# Patient Record
Sex: Female | Born: 1985 | Race: White | Hispanic: No | Marital: Single | Smoking: Current every day smoker
Health system: Southern US, Community
[De-identification: ages and names within clinical notes are randomized; demographics above are authoritative.]

## PROBLEM LIST (undated history)

## (undated) HISTORY — PX: TUBAL LIGATION: SHX77

## (undated) HISTORY — PX: CHOLECYSTECTOMY: SHX55

---

## 2013-05-18 ENCOUNTER — Encounter: Payer: Self-pay | Admitting: Maternal and Fetal Medicine

## 2013-10-07 ENCOUNTER — Observation Stay: Payer: Self-pay | Admitting: Obstetrics and Gynecology

## 2013-11-11 ENCOUNTER — Observation Stay: Payer: Self-pay | Admitting: Obstetrics and Gynecology

## 2013-11-11 LAB — URINALYSIS, COMPLETE
BILIRUBIN, UR: NEGATIVE
Bacteria: NONE SEEN
Blood: NEGATIVE
GLUCOSE, UR: NEGATIVE mg/dL (ref 0–75)
Ketone: NEGATIVE
LEUKOCYTE ESTERASE: NEGATIVE
NITRITE: NEGATIVE
PROTEIN: NEGATIVE
Ph: 6 (ref 4.5–8.0)
RBC,UR: 1 /HPF (ref 0–5)
SPECIFIC GRAVITY: 1.025 (ref 1.003–1.030)
Squamous Epithelial: 11
WBC UR: 5 /HPF (ref 0–5)

## 2013-11-23 ENCOUNTER — Observation Stay: Payer: Self-pay | Admitting: Obstetrics and Gynecology

## 2013-12-02 ENCOUNTER — Observation Stay: Payer: Self-pay | Admitting: Obstetrics and Gynecology

## 2013-12-05 ENCOUNTER — Inpatient Hospital Stay: Payer: Self-pay | Admitting: Obstetrics and Gynecology

## 2013-12-05 LAB — CBC WITH DIFFERENTIAL/PLATELET
BASOS ABS: 0.1 10*3/uL (ref 0.0–0.1)
BASOS PCT: 0.5 %
Eosinophil #: 0.1 10*3/uL (ref 0.0–0.7)
Eosinophil %: 0.4 %
HCT: 36.3 % (ref 35.0–47.0)
HGB: 11.7 g/dL — ABNORMAL LOW (ref 12.0–16.0)
LYMPHS ABS: 3.5 10*3/uL (ref 1.0–3.6)
Lymphocyte %: 18.3 %
MCH: 28.9 pg (ref 26.0–34.0)
MCHC: 32.1 g/dL (ref 32.0–36.0)
MCV: 90 fL (ref 80–100)
Monocyte #: 1.6 x10 3/mm — ABNORMAL HIGH (ref 0.2–0.9)
Monocyte %: 8.5 %
NEUTROS PCT: 72.3 %
Neutrophil #: 13.8 10*3/uL — ABNORMAL HIGH (ref 1.4–6.5)
PLATELETS: 263 10*3/uL (ref 150–440)
RBC: 4.04 10*6/uL (ref 3.80–5.20)
RDW: 14 % (ref 11.5–14.5)
WBC: 19.2 10*3/uL — AB (ref 3.6–11.0)

## 2013-12-05 LAB — GC/CHLAMYDIA PROBE AMP

## 2013-12-06 LAB — CBC WITH DIFFERENTIAL/PLATELET
Bands: 2 %
HCT: 35.5 % (ref 35.0–47.0)
HGB: 11.7 g/dL — ABNORMAL LOW (ref 12.0–16.0)
LYMPHS PCT: 23 %
MCH: 29.5 pg (ref 26.0–34.0)
MCHC: 33 g/dL (ref 32.0–36.0)
MCV: 90 fL (ref 80–100)
METAMYELOCYTE: 3 %
Monocytes: 2 %
Myelocyte: 1 %
Platelet: 262 10*3/uL (ref 150–440)
RBC: 3.97 10*6/uL (ref 3.80–5.20)
RDW: 14.1 % (ref 11.5–14.5)
Segmented Neutrophils: 66 %
Variant Lymphocyte - H1-Rlymph: 3 %
WBC: 14.1 10*3/uL — AB (ref 3.6–11.0)

## 2013-12-06 LAB — HEMATOCRIT: HCT: 35.1 % (ref 35.0–47.0)

## 2014-01-04 ENCOUNTER — Ambulatory Visit: Payer: Self-pay | Admitting: Obstetrics and Gynecology

## 2014-01-04 LAB — HEMOGLOBIN: HGB: 12.4 g/dL (ref 12.0–16.0)

## 2014-01-11 ENCOUNTER — Ambulatory Visit: Payer: Self-pay | Admitting: Obstetrics and Gynecology

## 2014-08-07 NOTE — Op Note (Signed)
PATIENT NAME:  Jillian Andersen, NEWGENT MR#:  [id number] DATE OF BIRTH:  [birth year]  DATE OF PROCEDURE:  01/11/2014  PREOPERATIVE DIAGNOSES:  1.  Multiparity, desiring permanent sterilization.  2.  Obesity.   POSTOPERATIVE DIAGNOSES: 1.  Multiparity, desiring permanent sterilization.  2.  Obesity.    OPERATION: Hysteroscopy with Essure tubal occlusion on right side only, failed occlusion of left side, and laparoscopic bilateral tubal ligation.   ANESTHESIA: General.   SURGEON: Hildred Laser, MD.   ESTIMATED BLOOD LOSS: Minimal.   OPERATIVE FLUIDS: 1200 mL.   URINE OUTPUT: 15 mL.   COMPLICATIONS: None.   FINDINGS: The uterus sounded to 8 cm.  On hysteroscopy, the endometrial lining was thinned, bilateral tubal ostia were identified.  The Essure coil was visualized from the right ostia after placement.  On laparoscopy, the uterus was noted to have several small anterior fibroids and another small fibroid near the fundus, but normal-appearing bilateral tubes and ovaries.   SPECIMENS: None.   TECHNIQUE: The patient was taken to the Operating Room where she was placed under general anesthesia without difficulty.  She was then prepped and draped in normal sterile fashion and placed, and dorsal lithotomy position using the candy cane stirrups.  A straight catheterization was performed.  Next, a sterile speculum was inserted into the vagina, and a single-tooth tenaculum was used to grasp the anterior lip of the cervix.  The cervix was noted to be appropriately dilated.  A 5 mm hysteroscope was introduced into the uterus under direct visualization.  The cavity was allowed to fill and the entire cavity was explored with the findings described above.  The Essure coil was introduced into the right tubal ostia and the device was deployed.  A total of 5 coils were noted to be protruding from the right fallopian tube ostia after deployment.  Attention was then turned to the left tubal ostia where Essure coil was  again attempted to be placed; however, the coil was noted to be bent at the tip and could not be used.  Because of this and the limited availability of other Essure devices, the decision was made to convert to laparoscopic bilateral tubal ligation (which the patient had also been previously consented for).  At this time, the speculum was removed.  The tenaculum was removed and excellent hemostasis was noted.  The patient was then re-prepped and re-draped for laparoscopic procedure and was placed in [place] stirrups. After this, the sterile speculum was then placed into the patient's vagina once again.  A Hulka clamp was placed for uterine manipulation. Attention was turned to the abdomen where a 1 cm infraumbilical skin incision was made.  Adjustment was made to the abdominal cavity and direct visualization with the insertion of a 12 mm trocar and sheath with the laparoscope introduced.  Once entrance into the abdominal cavity was confirmed, the abdomen was insufflated with appropriate volume and flow of CO2 gas.  Next, the Kleppinger device was inserted through the operative port of the laparoscope and the tube was grasped, elevated, and followed out to the fimbriated edge.  A 3 cm area was coagulated using the Kleppinger approximately 4 cm away from the tubal ostium.  This was then cut with the laparoscopic scissors.  Attention was then turned to the right fallopian tube where a similar procedure was carried out in the same fashion.  All areas were noted to be hemostatic.  All instruments were removed from the patient's abdomen and the abdomen was allowed to be  desufflated.  The trocar was removed from the patient's abdomen. The umbilical incision was then closed with 0 Vicryl at the level of the fascia in a figure-of-eight fashion. The skin was then closed using 4-0 Vicryl in a subcuticular fashion.  The incision was injected with 10 mL of 0.5% Sensorcaine.  The incision was then covered with Dermabond.  The Hulka  clamp was removed from the cervix.  The patient tolerated the procedure well.  Sponge, lap, and needle counts were correct x 2.  She will follow up postoperatively for follow-up care in approximately 2 weeks.      ____________________________ Jacques EarthlyAnika S. Valentino Saxonherry, MD asc:DT D: 01/11/2014 11:32:33 ET T: 01/11/2014 12:18:41 ET JOB#: [id number]  cc: Jacques EarthlyAnika S. Valentino Saxonherry, MD, <Dictator> Fabian NovemberANIKA S Rilda Bulls MD ELECTRONICALLY SIGNED 01/18/2014 7:53

## 2014-08-24 NOTE — H&P (Signed)
L&D Evaluation:  History:  HPI Jillian Andersen is a 29 y.o. [id number] Caucasian female at 40.0 weeks by LMP 02/13/14, EDD 12/05/13 who presented with c/o contractions, worsening since 5 a.m.  Contractions q 5-10 min apart. Denies LOF, decreased FM, vaginal bleeding.  Patient received PNC at [facility].   Presents with contractions   Patient's Medical History H/o postpartum depression, trichomoniasis this pregnancy (treated), obesity, h/o gallbladder cancer?   Patient's Surgical History Colecystectomy   Medications Pre Natal Vitamins   Allergies NKDA   Social History h/o tobacco abuse, h/o alchohol abuse (last use 2014), cannabinoid + UDS 09/26/13   Family History Diabetes   ROS:  General normal   HEENT normal   CNS normal   GI normal   GU contractions   Resp normal   CV normal   Renal normal   MS normal   Exam:  Vital Signs stable   Urine Protein negative dipstick   General no apparent distress, is s/p epidural   Mental Status clear   Chest clear   Heart normal sinus rhythm, no murmur/gallop/rubs   Abdomen gravid, non-tender   Estimated Fetal Weight Average for gestational age   Fetal Position cephalic   Back no CVAT   Edema no edema   Pelvic no external lesions, 6-7/100/c/-2   Mebranes Intact   FHT normal rate with no decels   Fetal Heart Rate 135   Ucx regular   Ucx Frequency 2 min   Length of each Contraction 30 seconds   Ucx Pain Scale 1   Skin dry, no lesions, no rashes   Lymph no lymphadenopathy   Other A+/-/ND/NR/RNI/VI/GC-/Cl-/GBS-.   Hgb/Hct (12/05/13): 19.2>11.7/36.3<263   Impression:  Impression active labor   Plan:  Plan monitor contractions and for cervical change   Comments Admitted to Labor and Delivery NPO Is s/p epidural placement.  Pitocin for contractions (spaced out after epidural).  Currently at 9 mIU. Will AROM. Moderate Leukocytosis - no s/s of chorioamnionitis. Will continue to monitor.   Electronic  Signatures: Fabian Novemberherry, Kanitra Purifoy S (MD)  (Signed 22-Aug-15 12:21)  Authored: L&D Evaluation   Last Updated: 22-Aug-15 12:21 by Fabian Novemberherry, Renardo Cheatum S (MD)

## 2014-10-30 ENCOUNTER — Encounter: Payer: Self-pay | Admitting: Emergency Medicine

## 2014-10-30 ENCOUNTER — Emergency Department: Payer: Self-pay

## 2014-10-30 ENCOUNTER — Emergency Department
Admission: EM | Admit: 2014-10-30 | Discharge: 2014-10-30 | Disposition: A | Discharge status: Home / Self Care | Payer: Self-pay | Attending: Emergency Medicine | Admitting: Emergency Medicine

## 2014-10-30 DIAGNOSIS — G43909 Migraine, unspecified, not intractable, without status migrainosus: Secondary | ICD-10-CM | POA: Insufficient documentation

## 2014-10-30 DIAGNOSIS — Z720 Tobacco use: Secondary | ICD-10-CM | POA: Insufficient documentation

## 2014-10-30 LAB — CBC WITH DIFFERENTIAL/PLATELET
BASOS PCT: 1 %
Basophils Absolute: 0.1 10*3/uL (ref 0–0.1)
Eosinophils Absolute: 0.1 10*3/uL (ref 0–0.7)
Eosinophils Relative: 1 %
HCT: 40.9 % (ref 35.0–47.0)
Hemoglobin: 13.7 g/dL (ref 12.0–16.0)
LYMPHS PCT: 38 %
Lymphs Abs: 3.6 10*3/uL (ref 1.0–3.6)
MCH: 29.4 pg (ref 26.0–34.0)
MCHC: 33.6 g/dL (ref 32.0–36.0)
MCV: 87.5 fL (ref 80.0–100.0)
MONOS PCT: 8 %
Monocytes Absolute: 0.8 10*3/uL (ref 0.2–0.9)
NEUTROS ABS: 4.8 10*3/uL (ref 1.4–6.5)
NEUTROS PCT: 52 %
Platelets: 246 10*3/uL (ref 150–440)
RBC: 4.67 MIL/uL (ref 3.80–5.20)
RDW: 13.2 % (ref 11.5–14.5)
WBC: 9.3 10*3/uL (ref 3.6–11.0)

## 2014-10-30 LAB — COMPREHENSIVE METABOLIC PANEL
ALT: 16 U/L (ref 14–54)
AST: 15 U/L (ref 15–41)
Albumin: 4 g/dL (ref 3.5–5.0)
Alkaline Phosphatase: 73 U/L (ref 38–126)
Anion gap: 8 (ref 5–15)
BUN: 14 mg/dL (ref 6–20)
CALCIUM: 9.4 mg/dL (ref 8.9–10.3)
CO2: 26 mmol/L (ref 22–32)
Chloride: 106 mmol/L (ref 101–111)
Creatinine, Ser: 0.79 mg/dL (ref 0.44–1.00)
GFR calc Af Amer: >60 mL/min (ref >60)
GLUCOSE: 91 mg/dL (ref 65–99)
Potassium: 3.6 mmol/L (ref 3.5–5.1)
SODIUM: 140 mmol/L (ref 135–145)
Total Bilirubin: 0.5 mg/dL (ref 0.3–1.2)
Total Protein: 7.3 g/dL (ref 6.5–8.1)

## 2014-10-30 MED ORDER — METOCLOPRAMIDE HCL 5 MG/ML IJ SOLN
10.0000 mg | Freq: Once | INTRAMUSCULAR | Status: AC
  Administered 2014-10-30: 10 mg via INTRAVENOUS
  Filled 2014-10-30: qty 2

## 2014-10-30 MED ORDER — DIPHENHYDRAMINE HCL 50 MG/ML IJ SOLN
12.5000 mg | Freq: Once | INTRAMUSCULAR | Status: AC
  Administered 2014-10-30: 12.5 mg via INTRAVENOUS
  Filled 2014-10-30: qty 1

## 2014-10-30 MED ORDER — SODIUM CHLORIDE 0.9 % IV BOLUS (SEPSIS)
1000.0000 mL | Freq: Once | INTRAVENOUS | Status: AC
  Administered 2014-10-30: 1000 mL via INTRAVENOUS

## 2014-10-30 MED ORDER — KETOROLAC TROMETHAMINE 30 MG/ML IJ SOLN
30.0000 mg | Freq: Once | INTRAMUSCULAR | Status: AC
  Administered 2014-10-30: 30 mg via INTRAVENOUS
  Filled 2014-10-30: qty 1

## 2014-10-30 MED ORDER — KETOROLAC TROMETHAMINE 10 MG PO TABS: 10.0000 mg | ORAL_TABLET | Freq: Four times a day (QID) | ORAL | Status: DC | PRN

## 2014-10-30 NOTE — Discharge Instructions (Signed)

## 2014-10-30 NOTE — ED Notes (Signed)
Case reviewed with PA, pt to flex.

## 2014-10-30 NOTE — ED Notes (Signed)
Denies history of Migraines

## 2014-10-30 NOTE — ED Notes (Signed)
Patient reports headache to mainly left side of head that has been going on for 3-4 days, taking over the counter meds without relief. Patient reports sensitivity to light and sound.

## 2014-10-30 NOTE — ED Provider Notes (Signed)
[facility] Emergency Department Provider Note ____________________________________________  Time seen: Approximately 7:02 PM  I have reviewed the triage vital signs and the nursing notes.   HISTORY  Chief Complaint Headache   HPI Jillian Andersen is a 29 y.o. female who presents to the emergency department for a headache. Headache is on the left side of her face and behind that left eye. She has never had a headache like this before. She states the headache started 3-4 days ago while at rest. She has taken ibuprofen twice since the onset without any relief. She states that she has occasional nausea. She states that light makes the headache much worse. Change of position and found does not affect the level of pain.   History reviewed. No pertinent past medical history.  There are no active problems to display for this patient.   History reviewed. No pertinent past surgical history.  Current Outpatient Rx  Name  Route  Sig  Dispense  Refill  . ketorolac (TORADOL) 10 MG tablet   Oral   Take 1 tablet (10 mg total) by mouth every 6 (six) hours as needed.   20 tablet   0     Allergies Review of patient's allergies indicates no known allergies.  History reviewed. No pertinent family history.  Social History History  Substance Use Topics  . Smoking status: Current Every Day Smoker  . Smokeless tobacco: Not on file  . Alcohol Use: No    Review of Systems Constitutional: No fever/chills Eyes: No visual changes. ENT: No sore throat. Cardiovascular: Denies chest pain. Respiratory: Denies shortness of breath. Gastrointestinal: No abdominal pain.  No nausea, no vomiting.  No diarrhea.  No constipation. Genitourinary: Negative for dysuria. Musculoskeletal: Negative for back pain. Skin: Negative for rash. Neurological: Positive for headaches, negative for focal weakness or numbness. Psychiatric:Normal mood and affect  10-point ROS otherwise  negative.  ____________________________________________   PHYSICAL EXAM:  VITAL SIGNS: ED Triage Vitals  Enc Vitals Group     BP 10/30/14 1540 133/115 mmHg     Pulse Rate 10/30/14 1540 93     Resp 10/30/14 1540 20     Temp 10/30/14 1540 98.1 F (36.7 C)     Temp Source 10/30/14 1540 Oral     SpO2 10/30/14 1540 96 %     Weight 10/30/14 1540 226 lb (102.513 kg)     Height 10/30/14 1540  (1.702 m)     Head Cir --      Peak Flow --      Pain Score 10/30/14 1541 8     Pain Loc --      Pain Edu? --      Excl. in GC? --     Constitutional: Alert and oriented. Well appearing and in no acute distress. Eyes: Conjunctivae are normal. PERRL. EOMI. Head: Atraumatic. Nose: No congestion/rhinnorhea. Mouth/Throat: Mucous membranes are moist.  Oropharynx non-erythematous. Neck: No stridor.   Cardiovascular: Normal rate, regular rhythm. Grossly normal heart sounds.  Good peripheral circulation. Respiratory: Normal respiratory effort.  No retractions. Lungs CTAB. Gastrointestinal: Soft and nontender. No distention. No abdominal bruits. No CVA tenderness. Musculoskeletal: No lower extremity tenderness nor edema.  No joint effusions. Neurologic:  Normal speech and language. No gross focal neurologic deficits are appreciated. No gait instability. Negative Romberg, negative finger to nose test Skin:  Skin is warm, dry and intact. No rash noted. Psychiatric: Mood and affect are normal. Speech and behavior are normal.  ____________________________________________  LABS (all labs ordered are listed, but only abnormal results are displayed)  Labs Reviewed  CBC WITH DIFFERENTIAL/PLATELET  COMPREHENSIVE METABOLIC PANEL   ____________________________________________  EKG   ____________________________________________  RADIOLOGY  CT head negative for acute pathology. ____________________________________________   PROCEDURES  Procedure(s) performed: None  Critical Care  performed: No  ____________________________________________   INITIAL IMPRESSION / ASSESSMENT AND PLAN / ED COURSE  Pertinent labs & imaging results that were available during my care of the patient were reviewed by me and considered in my medical decision making (see chart for details).  Patient received Toradol, Reglan, and Benadryl with complete relief of headache. She will be discharged home with a prescription for Toradol if the headache returns she is to take that as prescribed. She is to follow-up with her primary care provider or neurology for recurring headaches. She was advised to return to the emergency department for symptoms that change or worsen if she is unable schedule an appointment. ____________________________________________   FINAL CLINICAL IMPRESSION(S) / ED DIAGNOSES  Final diagnoses:  Migraine without status migrainosus, not intractable, unspecified migraine type      Chinita PesterCari B Rola Lennon, FNP 10/30/14 2346  Loleta Roseory Forbach, MD 10/31/14 0000

## 2015-04-10 ENCOUNTER — Encounter: Payer: Self-pay | Admitting: *Deleted

## 2015-04-10 ENCOUNTER — Emergency Department
Admission: EM | Admit: 2015-04-10 | Discharge: 2015-04-10 | Disposition: A | Discharge status: Home / Self Care | Payer: Self-pay | Attending: Emergency Medicine | Admitting: Emergency Medicine

## 2015-04-10 ENCOUNTER — Emergency Department: Payer: Self-pay

## 2015-04-10 DIAGNOSIS — J029 Acute pharyngitis, unspecified: Secondary | ICD-10-CM | POA: Insufficient documentation

## 2015-04-10 DIAGNOSIS — R61 Generalized hyperhidrosis: Secondary | ICD-10-CM | POA: Insufficient documentation

## 2015-04-10 DIAGNOSIS — F17200 Nicotine dependence, unspecified, uncomplicated: Secondary | ICD-10-CM | POA: Insufficient documentation

## 2015-04-10 DIAGNOSIS — F419 Anxiety disorder, unspecified: Secondary | ICD-10-CM | POA: Insufficient documentation

## 2015-04-10 LAB — CBC
HCT: 41.5 % (ref 35.0–47.0)
Hemoglobin: 13.8 g/dL (ref 12.0–16.0)
MCH: 28.8 pg (ref 26.0–34.0)
MCHC: 33.2 g/dL (ref 32.0–36.0)
MCV: 86.7 fL (ref 80.0–100.0)
Platelets: 226 10*3/uL (ref 150–440)
RBC: 4.79 MIL/uL (ref 3.80–5.20)
RDW: 13.2 % (ref 11.5–14.5)
WBC: 16 10*3/uL — ABNORMAL HIGH (ref 3.6–11.0)

## 2015-04-10 LAB — COMPREHENSIVE METABOLIC PANEL
ALK PHOS: 98 U/L (ref 38–126)
ALT: 95 U/L — ABNORMAL HIGH (ref 14–54)
ANION GAP: 8 (ref 5–15)
AST: 64 U/L — AB (ref 15–41)
Albumin: 4.3 g/dL (ref 3.5–5.0)
BILIRUBIN TOTAL: 1.5 mg/dL — AB (ref 0.3–1.2)
BUN: 16 mg/dL (ref 6–20)
CO2: 26 mmol/L (ref 22–32)
Calcium: 9.3 mg/dL (ref 8.9–10.3)
Chloride: 103 mmol/L (ref 101–111)
Creatinine, Ser: 0.74 mg/dL (ref 0.44–1.00)
GFR calc Af Amer: >60 mL/min (ref >60)
GFR calc non Af Amer: >60 mL/min (ref >60)
GLUCOSE: 94 mg/dL (ref 65–99)
Potassium: 3.7 mmol/L (ref 3.5–5.1)
SODIUM: 137 mmol/L (ref 135–145)
Total Protein: 8 g/dL (ref 6.5–8.1)

## 2015-04-10 LAB — TROPONIN I: Troponin I: <0.03 ng/mL (ref <0.031)

## 2015-04-10 LAB — POCT RAPID STREP A: Streptococcus, Group A Screen (Direct): NEGATIVE

## 2015-04-10 MED ORDER — PENICILLIN V POTASSIUM 250 MG PO TABS
250.0000 mg | ORAL_TABLET | Freq: Four times a day (QID) | ORAL | Status: DC
Start: 2015-04-10 — End: 2024-02-11

## 2015-04-10 MED ORDER — EPINEPHRINE HCL 1 MG/ML IJ SOLN
INTRAMUSCULAR | Status: AC
  Administered 2015-04-10: 0.3 mg
  Filled 2015-04-10: qty 1

## 2015-04-10 MED ORDER — SODIUM CHLORIDE 0.9 % IV SOLN
1000.0000 mL | Freq: Once | INTRAVENOUS | Status: AC
  Administered 2015-04-10: 1000 mL via INTRAVENOUS

## 2015-04-10 MED ORDER — DEXAMETHASONE SODIUM PHOSPHATE 10 MG/ML IJ SOLN
10.0000 mg | Freq: Once | INTRAMUSCULAR | Status: AC
  Administered 2015-04-10: 10 mg via INTRAVENOUS
  Filled 2015-04-10: qty 1

## 2015-04-10 MED ORDER — EPINEPHRINE 0.3 MG/0.3ML IJ SOAJ: 0.3000 mg | Freq: Once | INTRAMUSCULAR | Status: AC

## 2015-04-10 NOTE — Discharge Instructions (Signed)

## 2015-04-10 NOTE — ED Provider Notes (Signed)
[facility] Emergency Department Provider Note  ____________________________________________  Time seen: On arrival  I have reviewed the triage vital signs and the nursing notes.   HISTORY  Chief Complaint Shortness of Breath and Sore Throat    HPI Jillian Andersen is a 29 y.o. female who presents with complaints of syncope and difficulty swallowing. Apparently patient was taking care of the kids and became lightheaded and syncopized. Afterwards she complained of sore throat and a sensation of swelling in her throat. She denies rash or itching. No nausea or vomiting. No fevers or chills. No sick contacts, no shortness of breath at this time     History reviewed. No pertinent past medical history.  There are no active problems to display for this patient.   History reviewed. No pertinent past surgical history.  Current Outpatient Rx  Name  Route  Sig  Dispense  Refill  . ketorolac (TORADOL) 10 MG tablet   Oral   Take 1 tablet (10 mg total) by mouth Andersen 6 (six) hours as needed.   20 tablet   0     Allergies Review of patient's allergies indicates no known allergies.  History reviewed. No pertinent family history.  Social History Social History  Substance Use Topics  . Smoking status: Current Andersen Day Smoker  . Smokeless tobacco: None  . Alcohol Use: No    Review of Systems  Constitutional: Negative for fever. Eyes: Negative for visual changes. ENT: As above Cardiovascular: Negative for chest pain. Respiratory: As above Gastrointestinal: Negative for abdominal pain, vomiting and diarrhea. Genitourinary: Negative for dysuria. Musculoskeletal: Negative for back pain. Skin: Negative for rash. Neurological: Negative for headaches or focal weakness Psychiatric: Positive for anxiety    ____________________________________________   PHYSICAL EXAM:  VITAL SIGNS: ED Triage Vitals  Enc Vitals Group     BP --      Pulse Rate  04/10/15 1227 81     Resp 04/10/15 1227 20     Temp 04/10/15 1227 98.3 F (36.8 C)     Temp Source 04/10/15 1227 Oral     SpO2 04/10/15 1227 98 %     Weight 04/10/15 1227 230 lb (104.327 kg)     Height 04/10/15 1227 5\' 3"  (1.6 m)     Head Cir --      Peak Flow --      Pain Score 04/10/15 1230 7     Pain Loc --      Pain Edu? --      Excl. in GC? --      Constitutional: Alert and oriented but anxious, diaphoretic Eyes: Conjunctivae are normal.  ENT   Head: Normocephalic and atraumatic.   Mouth/Throat: Mucous membranes are moist. Enlarged tonsils bilaterally, positive erythema, no exudate noted, uvula is normal Cardiovascular: Normal rate, regular rhythm. Normal and symmetric distal pulses are present in all extremities. No murmurs, rubs, or gallops. Respiratory: Normal respiratory effort without tachypnea nor retractions. Breath sounds are clear and equal bilaterally. No stridor Gastrointestinal: Soft and non-tender in all quadrants. No distention. There is no CVA tenderness. Genitourinary: deferred Musculoskeletal: Nontender with normal range of motion in all extremities. No lower extremity tenderness nor edema. Neurologic:  Normal speech and language. No gross focal neurologic deficits are appreciated. Skin:  Skin is warm, diaphoretic and intact. No rash noted. Psychiatric: Mood and affect are normal. Patient exhibits appropriate insight and judgment.  ____________________________________________    LABS (pertinent positives/negatives)  Labs Reviewed  CBC  COMPREHENSIVE METABOLIC  PANEL  TROPONIN I    ____________________________________________   EKG   ____________________________________________    RADIOLOGY I have personally reviewed any xrays that were ordered on this patient: Soft tissue neck is normal  ____________________________________________   PROCEDURES  Procedure(s) performed: none  Critical Care performed:  none  ____________________________________________   INITIAL IMPRESSION / ASSESSMENT AND PLAN / ED COURSE  Pertinent labs & imaging results that were available during my care of the patient were reviewed by me and considered in my medical decision making (see chart for details).  Given syncope, diaphoresis and sensation of throat swelling strong concern for anaphylaxis, IM epi 0.3 mg 1 1000 ordered Decadron IV given. Normal saline ordered.  Upon further questioning patient admits to having sore throat this morning which has worsened throughout the day which lessens the likelihood of anaphylaxis.  ----------------------------------------- 2:04 PM on 04/10/2015 -----------------------------------------  Patient is feeling improved. Elevated white blood cell count and history of present illness of worsening sore throat since this a.m. makes me feel more comfortable that this is pharyngitis as opposed to anaphylaxis. Unclear cause of syncope but no chest pain shortness of breath and labwork and EKG unremarkable. She has received Decadron and I will discharge her with antibiotics. Return precautions discussed. All vitals normal discharge  ____________________________________________   FINAL CLINICAL IMPRESSION(S) / ED DIAGNOSES  Final diagnoses:  Pharyngitis     Jillian Every, MD 04/10/15 1406

## 2015-04-10 NOTE — ED Notes (Signed)
Pt had syncopal episode at her familys house and now states she feels difficulty swallowing, arrives with swollen tonsils and throats, pt on RA, MD at bedside, pt able to speak in full sentances, tearful

## 2015-04-13 LAB — CULTURE, GROUP A STREP (THRC)

## 2016-01-14 ENCOUNTER — Emergency Department
Admission: EM | Admit: 2016-01-14 | Discharge: 2016-01-14 | Disposition: A | Discharge status: Home / Self Care | Payer: Self-pay | Attending: Student | Admitting: Student

## 2016-01-14 ENCOUNTER — Encounter: Payer: Self-pay | Admitting: Emergency Medicine

## 2016-01-14 DIAGNOSIS — J020 Streptococcal pharyngitis: Secondary | ICD-10-CM | POA: Insufficient documentation

## 2016-01-14 DIAGNOSIS — R197 Diarrhea, unspecified: Secondary | ICD-10-CM | POA: Insufficient documentation

## 2016-01-14 DIAGNOSIS — Z79899 Other long term (current) drug therapy: Secondary | ICD-10-CM | POA: Insufficient documentation

## 2016-01-14 DIAGNOSIS — F17210 Nicotine dependence, cigarettes, uncomplicated: Secondary | ICD-10-CM | POA: Insufficient documentation

## 2016-01-14 DIAGNOSIS — R252 Cramp and spasm: Secondary | ICD-10-CM | POA: Insufficient documentation

## 2016-01-14 LAB — CBC WITH DIFFERENTIAL/PLATELET
Basophils Absolute: 0.1 10*3/uL (ref 0–0.1)
Basophils Relative: 1 %
EOS PCT: 0 %
Eosinophils Absolute: 0 10*3/uL (ref 0–0.7)
HEMATOCRIT: 41.4 % (ref 35.0–47.0)
Hemoglobin: 14.3 g/dL (ref 12.0–16.0)
LYMPHS ABS: 1.8 10*3/uL (ref 1.0–3.6)
LYMPHS PCT: 9 %
MCH: 30.3 pg (ref 26.0–34.0)
MCHC: 34.5 g/dL (ref 32.0–36.0)
MCV: 87.8 fL (ref 80.0–100.0)
MONO ABS: 1.2 10*3/uL — AB (ref 0.2–0.9)
Monocytes Relative: 6 %
Neutro Abs: 16.6 10*3/uL — ABNORMAL HIGH (ref 1.4–6.5)
Neutrophils Relative %: 84 %
PLATELETS: 244 10*3/uL (ref 150–440)
RBC: 4.72 MIL/uL (ref 3.80–5.20)
RDW: 13.7 % (ref 11.5–14.5)
WBC: 19.7 10*3/uL — ABNORMAL HIGH (ref 3.6–11.0)

## 2016-01-14 LAB — COMPREHENSIVE METABOLIC PANEL
ALT: 19 U/L (ref 14–54)
AST: 21 U/L (ref 15–41)
Albumin: 4.1 g/dL (ref 3.5–5.0)
Alkaline Phosphatase: 79 U/L (ref 38–126)
Anion gap: 9 (ref 5–15)
BILIRUBIN TOTAL: 1.3 mg/dL — AB (ref 0.3–1.2)
BUN: 16 mg/dL (ref 6–20)
CALCIUM: 8.9 mg/dL (ref 8.9–10.3)
CHLORIDE: 100 mmol/L — AB (ref 101–111)
CO2: 27 mmol/L (ref 22–32)
CREATININE: 1.23 mg/dL — AB (ref 0.44–1.00)
GFR, EST NON AFRICAN AMERICAN: 57 mL/min — AB (ref >60)
Glucose, Bld: 104 mg/dL — ABNORMAL HIGH (ref 65–99)
Potassium: 3.5 mmol/L (ref 3.5–5.1)
Sodium: 136 mmol/L (ref 135–145)
TOTAL PROTEIN: 7.9 g/dL (ref 6.5–8.1)

## 2016-01-14 LAB — POCT PREGNANCY, URINE: Preg Test, Ur: NEGATIVE

## 2016-01-14 LAB — POCT RAPID STREP A: STREPTOCOCCUS, GROUP A SCREEN (DIRECT): POSITIVE — AB

## 2016-01-14 LAB — INFLUENZA PANEL BY PCR (TYPE A & B)
H1N1 flu by pcr: NOT DETECTED
INFLAPCR: NEGATIVE
Influenza B By PCR: NEGATIVE

## 2016-01-14 LAB — MAGNESIUM: MAGNESIUM: 1.5 mg/dL — AB (ref 1.7–2.4)

## 2016-01-14 MED ORDER — MAGNESIUM SULFATE 2 GM/50ML IV SOLN
2.0000 g | Freq: Once | INTRAVENOUS | Status: AC
  Administered 2016-01-14: 2 g via INTRAVENOUS
  Filled 2016-01-14: qty 50

## 2016-01-14 MED ORDER — ACETAMINOPHEN 500 MG PO TABS
1000.0000 mg | ORAL_TABLET | Freq: Once | ORAL | Status: AC
  Administered 2016-01-14: 1000 mg via ORAL
  Filled 2016-01-14: qty 2

## 2016-01-14 MED ORDER — AMOXICILLIN 500 MG PO TABS: 500.0000 mg | ORAL_TABLET | Freq: Two times a day (BID) | ORAL | 0 refills | Status: AC

## 2016-01-14 MED ORDER — SODIUM CHLORIDE 0.9 % IV BOLUS (SEPSIS)
1000.0000 mL | Freq: Once | INTRAVENOUS | Status: AC
  Administered 2016-01-14: 1000 mL via INTRAVENOUS

## 2016-01-14 MED ORDER — KETOROLAC TROMETHAMINE 30 MG/ML IJ SOLN
15.0000 mg | Freq: Once | INTRAMUSCULAR | Status: AC
  Administered 2016-01-14: 15 mg via INTRAVENOUS
  Filled 2016-01-14: qty 1

## 2016-01-14 MED ORDER — AMOXICILLIN 500 MG PO CAPS
500.0000 mg | ORAL_CAPSULE | Freq: Once | ORAL | Status: AC
  Administered 2016-01-14: 500 mg via ORAL
  Filled 2016-01-14: qty 1

## 2016-01-14 NOTE — ED Notes (Signed)
Dr. Inocencio HomesGayle informed patients rapid strept test was positive

## 2016-01-14 NOTE — ED Provider Notes (Signed)
[facility] Emergency Department Provider Note   ____________________________________________   First MD Initiated Contact with Patient 01/14/16 1054     (approximate)  I have reviewed the triage vital signs and the nursing notes.   HISTORY  Chief Complaint Anxiety; Fever; and Sore Throat    HPI Jillian Andersen is a 30 y.o. female with no chronic medical problems who presents for evaluation of sore throat as well as hand cramping since last night, myalgias, fevers, gradual onset, constant, severe, no modifying factors she is also had nonbloody diarrhea. No abdominal pain, no nausea or vomiting. She has had runny nose and cough. No chest pain or difficulty breathing.   History reviewed. No pertinent past medical history.  There are no active problems to display for this patient.   Past Surgical History:  Procedure Laterality Date  . CHOLECYSTECTOMY      Prior to Admission medications   Medication Sig Start Date End Date Taking? Authorizing Provider  ketorolac (TORADOL) 10 MG tablet Take 1 tablet (10 mg total) by mouth every 6 (six) hours as needed. 10/30/14   Chinita Pesterari B Triplett, FNP  penicillin v potassium (VEETID) 250 MG tablet Take 1 tablet (250 mg total) by mouth 4 (four) times daily. 04/10/15   Jene Everyobert Kinner, MD    Allergies Review of patient's allergies indicates no known allergies.  History reviewed. No pertinent family history.  Social History Social History  Substance Use Topics  . Smoking status: Current Every Day Smoker    Packs/day: 0.50    Types: Cigarettes  . Smokeless tobacco: Never Used  . Alcohol use No    Review of Systems Constitutional: + fever/chills Eyes: No visual changes. ENT: + sore throat. Cardiovascular: Denies chest pain. Respiratory: Denies shortness of breath. Gastrointestinal: No abdominal pain.  No nausea, no vomiting.  + diarrhea.  No constipation. Genitourinary: Negative for dysuria. Musculoskeletal:  Negative for back pain. Skin: Negative for rash. Neurological: Negative for headaches, focal weakness or numbness.  10-point ROS otherwise negative.  ____________________________________________   PHYSICAL EXAM:  Vitals:   01/14/16 1045 01/14/16 1057 01/14/16 1248  BP:  (!) 102/52 (!) 122/59  Pulse: (!) 113 98 90  Resp:  20 15  Temp: (!) 101.3 F (38.5 C)  98.8 F (37.1 C)  TempSrc: Oral  Oral  SpO2: 100% 100% 98%  Weight: 200 lb (90.7 kg)    Height: 5\' 7"  (1.702 m)      VITAL SIGNS: ED Triage Vitals  Enc Vitals Group     BP 01/14/16 1057 (!) 102/52     Pulse Rate 01/14/16 1045 (!) 113     Resp 01/14/16 1057 20     Temp 01/14/16 1045 (!) 101.3 F (38.5 C)     Temp Source 01/14/16 1045 Oral     SpO2 01/14/16 1045 100 %     Weight 01/14/16 1045 200 lb (90.7 kg)     Height 01/14/16 1045 5\' 7"  (1.702 m)     Head Circumference --      Peak Flow --      Pain Score 01/14/16 1045 8     Pain Loc --      Pain Edu? --      Excl. in GC? --     Constitutional: Alert and oriented. Nontoxic-appearing and in no acute distress. Eyes: Conjunctivae are normal. PERRL. EOMI. Head: Atraumatic. Nose: No congestion/rhinnorhea. Mouth/Throat: Mucous membranes are moist.  Oropharynx is erythematous without exudate, the tonsils are enlarged bilaterally,  symmetrically. There is no uvular deviation. No hoarse voice, handling secretions. Neck: No stridor.  Supple without meningismus. Cardiovascular: Normal rate, regular rhythm. Grossly normal heart sounds.  Good peripheral circulation. Respiratory: Normal respiratory effort.  No retractions. Lungs CTAB. Gastrointestinal: Soft and nontender. No distention. No CVA tenderness. Genitourinary: deferred Musculoskeletal: No lower extremity tenderness nor edema.  No joint effusions. Neurologic:  Normal speech and language. No gross focal neurologic deficits are appreciated. No gait instability. Skin:  Skin is warm, dry and intact. No rash  noted. Psychiatric: Mood and affect are normal. Speech and behavior are normal.  ____________________________________________   LABS (all labs ordered are listed, but only abnormal results are displayed)  Labs Reviewed  CBC WITH DIFFERENTIAL/PLATELET - Abnormal; Notable for the following:       Result Value   WBC 19.7 (*)    Neutro Abs 16.6 (*)    Monocytes Absolute 1.2 (*)    All other components within normal limits  COMPREHENSIVE METABOLIC PANEL - Abnormal; Notable for the following:    Chloride 100 (*)    Glucose, Bld 104 (*)    Creatinine, Ser 1.23 (*)    Total Bilirubin 1.3 (*)    GFR calc non Af Amer 57 (*)    All other components within normal limits  MAGNESIUM - Abnormal; Notable for the following:    Magnesium 1.5 (*)    All other components within normal limits  POCT RAPID STREP A - Abnormal; Notable for the following:    Streptococcus, Group A Screen (Direct) POSITIVE (*)    All other components within normal limits  INFLUENZA PANEL BY PCR (TYPE A & B, H1N1)  POC URINE PREG, ED  POCT PREGNANCY, URINE   ____________________________________________  EKG  none ____________________________________________  RADIOLOGY  none ____________________________________________   PROCEDURES  Procedure(s) performed: None  Procedures  Critical Care performed: No  ____________________________________________   INITIAL IMPRESSION / ASSESSMENT AND PLAN / ED COURSE  Pertinent labs & imaging results that were available during my care of the patient were reviewed by me and considered in my medical decision making (see chart for details).  Jillian Andersen is a 30 y.o. female with no chronic medical problems who presents for evaluation of sore throat as well as hand cramping since last night. On exam, she is nontoxic appearing and in no acute distress. She is febrile and was mildly tachycardic on arrival however that tachycardia resolved at the time of my assessment. She  does have erythema of the oropharynx without evidence of peritonsillar abscess, we'll obtain screening labs, including magnesium given her complaints of hand cramping. We'll swab for strep, treat her symptomatically with antipyretics and IV fluids. Reassess for disposition. Will swab for influenza.  ----------------------------------------- 1:47 PM on 01/14/2016 ----------------------------------------- Patient reports she feels much better at this time. Her vital signs have normalized. White blood cell count was elevated at 19.7, strep test is positive which I suspect is the most likely cause of her constellation of symptoms. She received amoxicillin and will be discharged with the same. Influenza negative. CMP with mild creatinine elevation at 1.23,  she received IV fluids. Her magnesium was slightly low at 1.5 which could have caused her to have hand cramping, she received 2 g of magnesium IV. Pregnancy test is negative. We discussed return precautions, need for close PCP follow-up and she is comfortable with the discharge plan. DC home.   Clinical Course     ____________________________________________   FINAL CLINICAL IMPRESSION(S) / ED DIAGNOSES  Final diagnoses:  Strep throat  Hypomagnesemia  Cramping of hands      NEW MEDICATIONS STARTED DURING THIS VISIT:  New Prescriptions   No medications on file     Note:  This document was prepared using Dragon voice recognition software and may include unintentional dictation errors.    Gayla Doss, MD 01/14/16 [phone]

## 2016-01-14 NOTE — ED Triage Notes (Signed)
Pt presents to ED via POV by herself. Pt states since last night she has been having a sore throat and muscle spasms in her hands.  Pt is anxious and panicking in triage.  Pt unable to tolerate BP cuff as she said her muscles in her arm and hand were spasms, pt screaming in pain.

## 2017-02-14 IMAGING — CT CT HEAD W/O CM
1 series · 16 of 30 positions shown, 20 images · non-contrast
Comparison: None.

CLINICAL DATA: Left headache x 3-4 days

EXAM:
CT HEAD WITHOUT CONTRAST
TECHNIQUE: Contiguous axial images were obtained from the base of the skull
through the vertex without intravenous contrast.

[Series 2: head wo · axial · 0.40mm/px · z∈[-125,+1]mm · 16 of 32 slices shown, 20 images]
[im 2/32  brain]
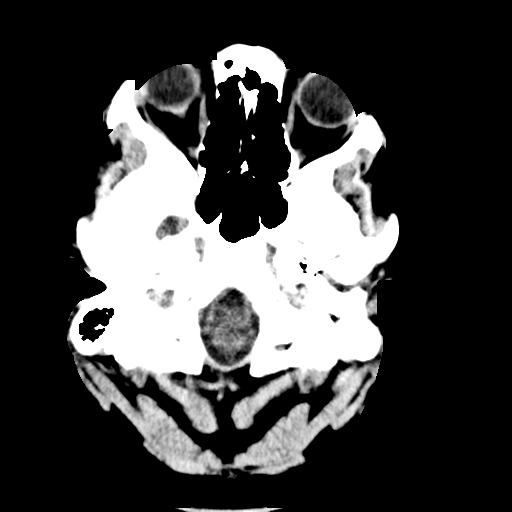
[im 2/32  bone]
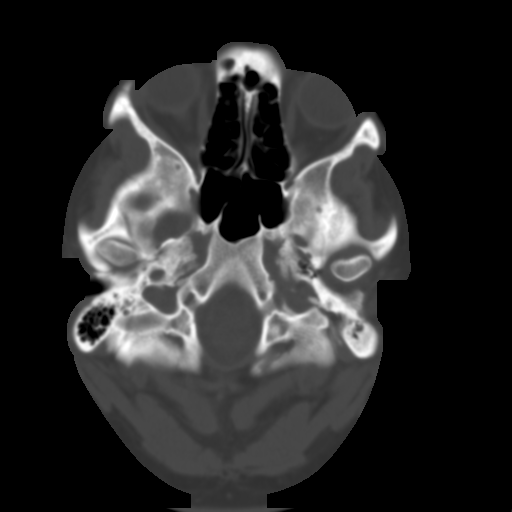
[im 4/32  brain]
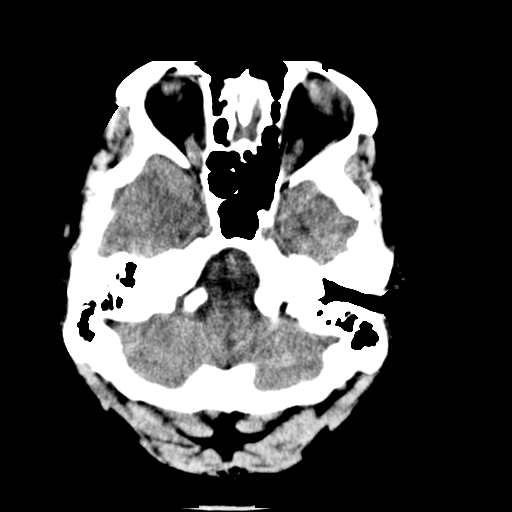
[im 6/32  brain]
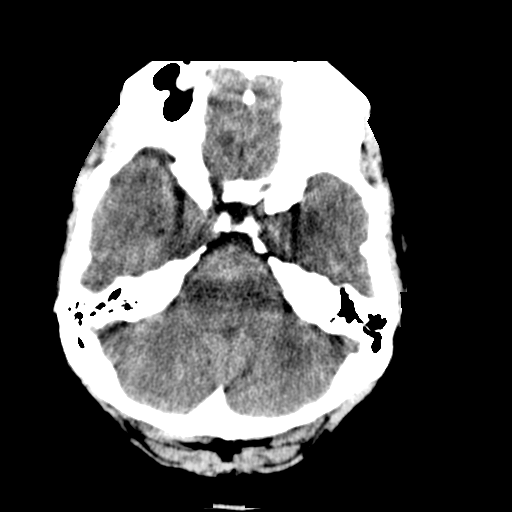
[im 8/32  brain]
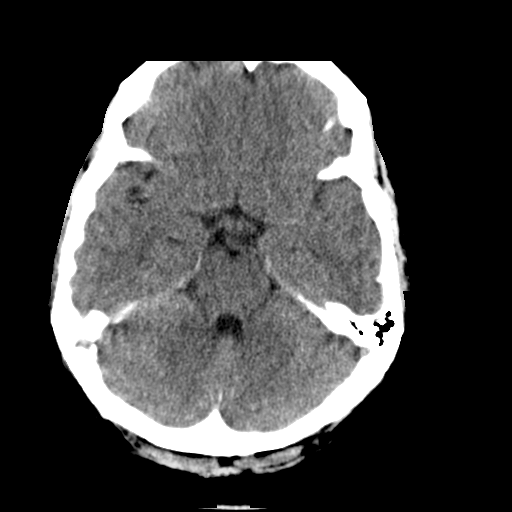
[im 9/32  brain]
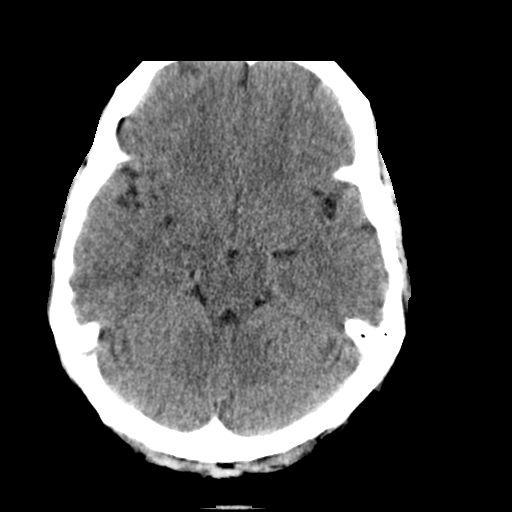
[im 9/32  bone]
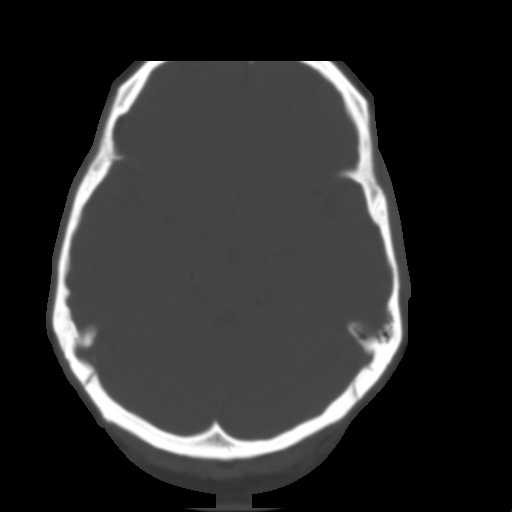
[im 11/32  brain]
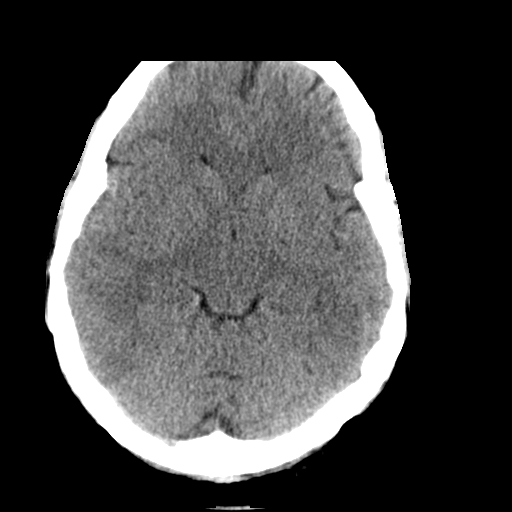
[im 13/32  brain]
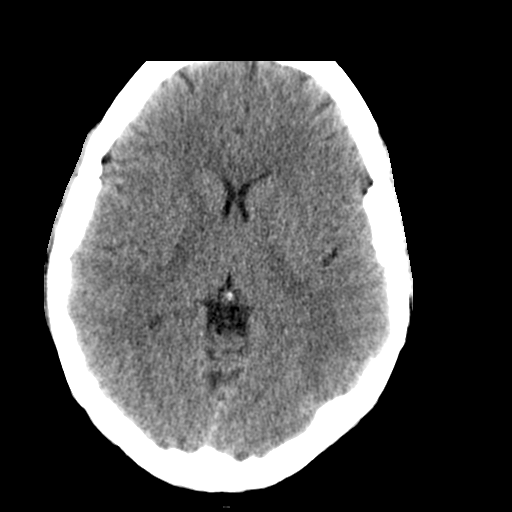
[im 15/32  brain]
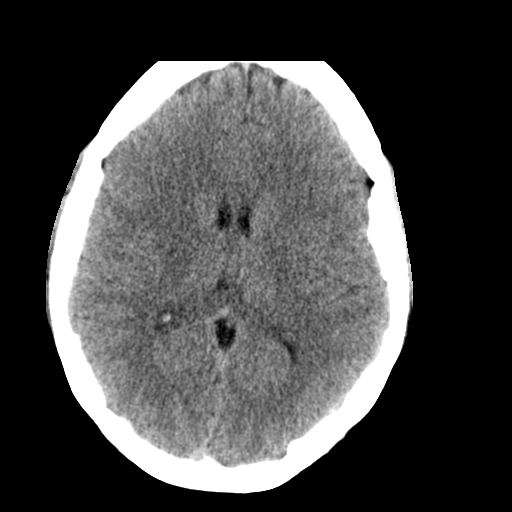
[im 17/32  brain]
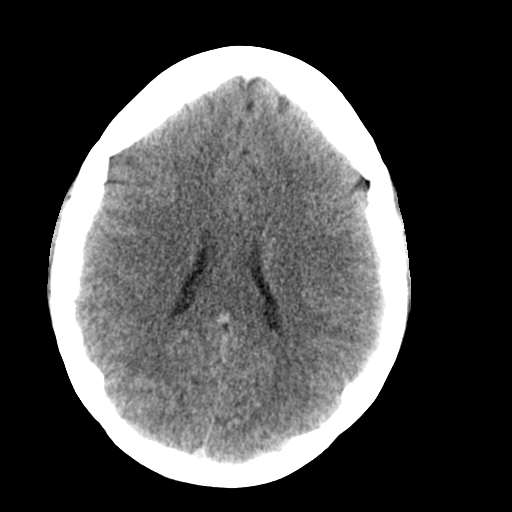
[im 17/32  bone]
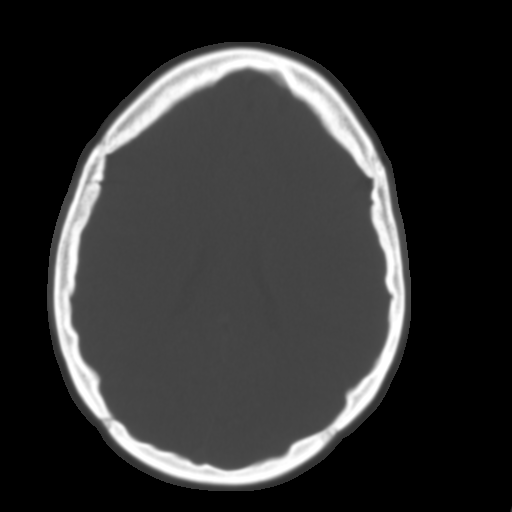
[im 19/32  brain]
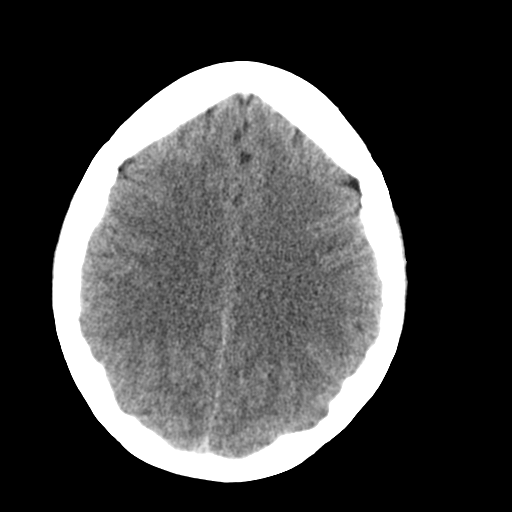
[im 21/32  brain]
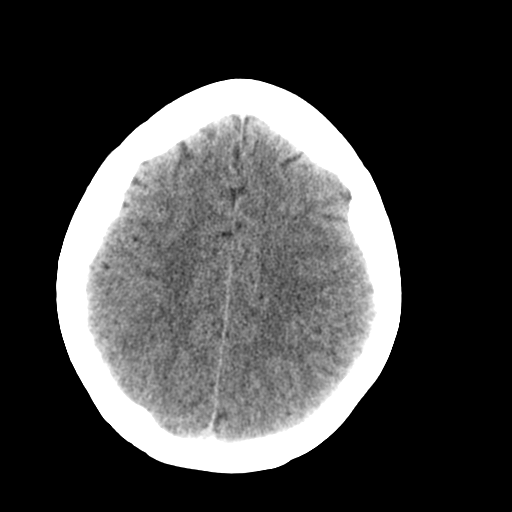
[im 23/32  brain]
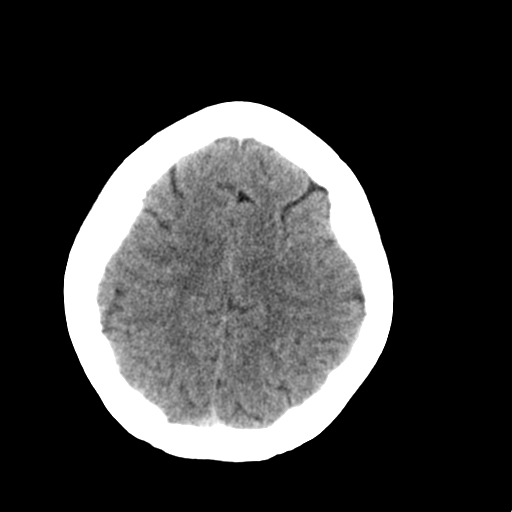
[im 24/32  brain]
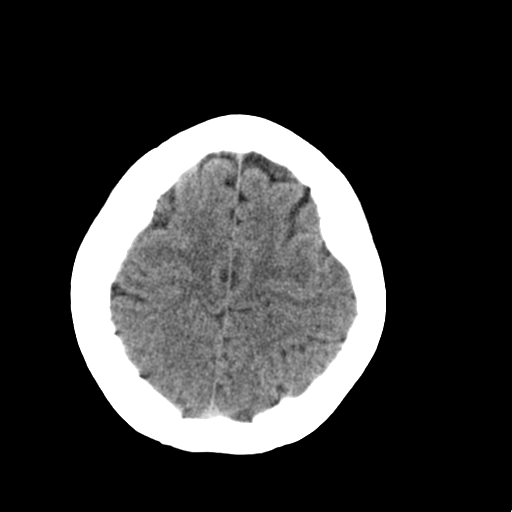
[im 24/32  bone]
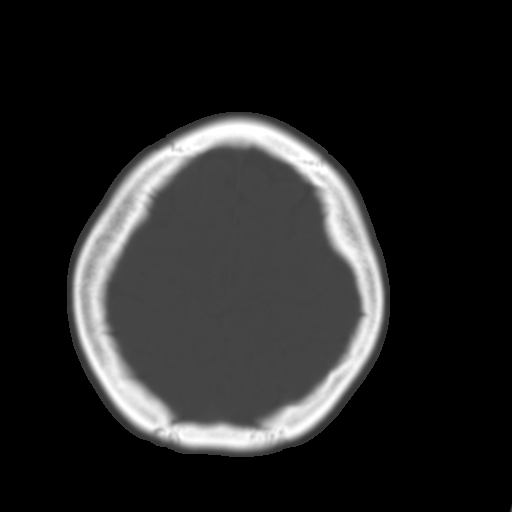
[im 26/32  brain]
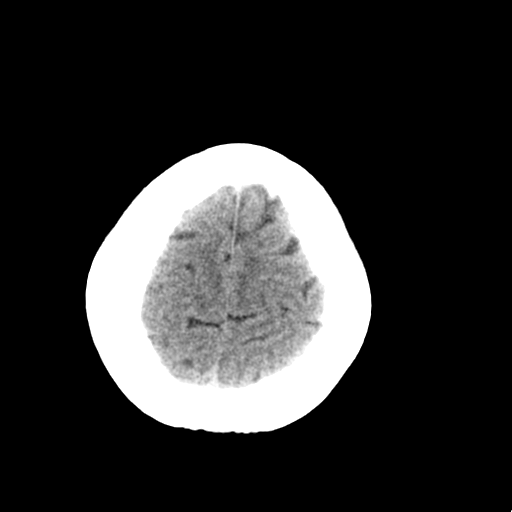
[im 28/32  brain]
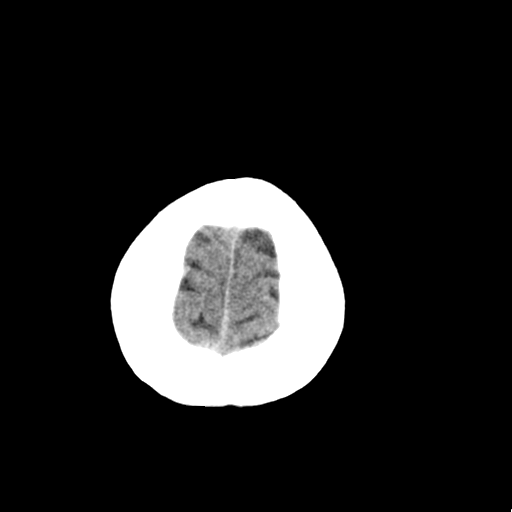
[im 30/32  brain]
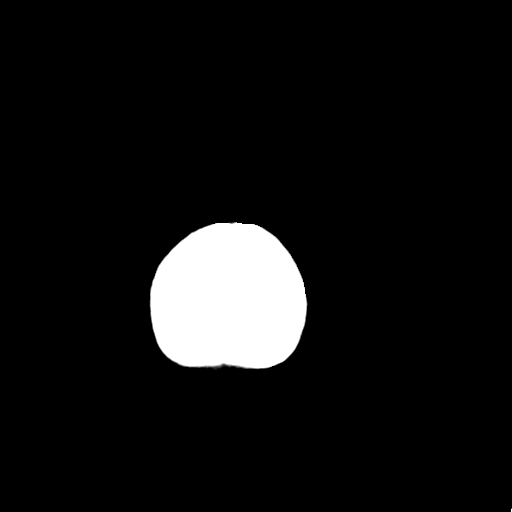

[16 of 30 positions shown; findings below may reference images not displayed]

FINDINGS: No evidence of parenchymal hemorrhage or extra-axial fluid
collection. No mass lesion, mass effect, or midline shift.

No CT evidence of acute infarction.

Cerebral volume is within normal limits.  No ventriculomegaly.

The visualized paranasal sinuses are essentially clear. The mastoid
air cells are unopacified.

No evidence of calvarial fracture.
IMPRESSION: Normal head CT.

## 2019-11-01 ENCOUNTER — Encounter: Payer: Self-pay | Admitting: Emergency Medicine

## 2019-11-01 ENCOUNTER — Emergency Department: Payer: HRSA Program

## 2019-11-01 ENCOUNTER — Other Ambulatory Visit: Payer: Self-pay

## 2019-11-01 DIAGNOSIS — R509 Fever, unspecified: Secondary | ICD-10-CM | POA: Diagnosis present

## 2019-11-01 DIAGNOSIS — R112 Nausea with vomiting, unspecified: Secondary | ICD-10-CM | POA: Insufficient documentation

## 2019-11-01 DIAGNOSIS — R0602 Shortness of breath: Secondary | ICD-10-CM | POA: Diagnosis not present

## 2019-11-01 DIAGNOSIS — R05 Cough: Secondary | ICD-10-CM | POA: Insufficient documentation

## 2019-11-01 DIAGNOSIS — F17210 Nicotine dependence, cigarettes, uncomplicated: Secondary | ICD-10-CM | POA: Diagnosis not present

## 2019-11-01 DIAGNOSIS — U071 COVID-19: Secondary | ICD-10-CM | POA: Insufficient documentation

## 2019-11-01 DIAGNOSIS — R079 Chest pain, unspecified: Secondary | ICD-10-CM | POA: Insufficient documentation

## 2019-11-01 LAB — CBC
HCT: 45.7 % (ref 36.0–46.0)
Hemoglobin: 15.3 g/dL — ABNORMAL HIGH (ref 12.0–15.0)
MCH: 30.1 pg (ref 26.0–34.0)
MCHC: 33.5 g/dL (ref 30.0–36.0)
MCV: 89.8 fL (ref 80.0–100.0)
Platelets: 177 10*3/uL (ref 150–400)
RBC: 5.09 MIL/uL (ref 3.87–5.11)
RDW: 12.4 % (ref 11.5–15.5)
WBC: 5.5 10*3/uL (ref 4.0–10.5)
nRBC: 0 % (ref 0.0–0.2)

## 2019-11-01 LAB — BASIC METABOLIC PANEL
Anion gap: 9 (ref 5–15)
BUN: 11 mg/dL (ref 6–20)
CO2: 24 mmol/L (ref 22–32)
Calcium: 8.7 mg/dL — ABNORMAL LOW (ref 8.9–10.3)
Chloride: 102 mmol/L (ref 98–111)
Creatinine, Ser: 1.29 mg/dL — ABNORMAL HIGH (ref 0.44–1.00)
GFR calc Af Amer: >60 mL/min (ref >60)
GFR calc non Af Amer: 53 mL/min — ABNORMAL LOW (ref >60)
Glucose, Bld: 103 mg/dL — ABNORMAL HIGH (ref 70–99)
Potassium: 3.7 mmol/L (ref 3.5–5.1)
Sodium: 135 mmol/L (ref 135–145)

## 2019-11-01 LAB — TROPONIN I (HIGH SENSITIVITY): Troponin I (High Sensitivity): 4 ng/L (ref <18)

## 2019-11-01 NOTE — ED Triage Notes (Signed)
Patient with complaint of central chest pain, cough, chills and body aches times one week.

## 2019-11-02 ENCOUNTER — Emergency Department
Admission: EM | Admit: 2019-11-02 | Discharge: 2019-11-02 | Disposition: A | Discharge status: Home / Self Care | Payer: HRSA Program | Attending: Emergency Medicine | Admitting: Emergency Medicine

## 2019-11-02 DIAGNOSIS — R0602 Shortness of breath: Secondary | ICD-10-CM

## 2019-11-02 DIAGNOSIS — R112 Nausea with vomiting, unspecified: Secondary | ICD-10-CM

## 2019-11-02 DIAGNOSIS — U071 COVID-19: Secondary | ICD-10-CM

## 2019-11-02 LAB — SARS CORONAVIRUS 2 BY RT PCR (HOSPITAL ORDER, PERFORMED IN ~~LOC~~ HOSPITAL LAB): SARS Coronavirus 2: POSITIVE — AB

## 2019-11-02 MED ORDER — LACTATED RINGERS IV BOLUS
1000.0000 mL | Freq: Once | INTRAVENOUS | Status: AC
  Administered 2019-11-02: 1000 mL via INTRAVENOUS

## 2019-11-02 MED ORDER — KETOROLAC TROMETHAMINE 30 MG/ML IJ SOLN
15.0000 mg | Freq: Once | INTRAMUSCULAR | Status: AC
  Administered 2019-11-02: 15 mg via INTRAVENOUS
  Filled 2019-11-02: qty 1

## 2019-11-02 MED ORDER — ONDANSETRON 4 MG PO TBDP: 4.0000 mg | ORAL_TABLET | Freq: Three times a day (TID) | ORAL | 0 refills | Status: DC | PRN

## 2019-11-02 MED ORDER — ONDANSETRON HCL 4 MG/2ML IJ SOLN
4.0000 mg | Freq: Once | INTRAMUSCULAR | Status: AC
  Administered 2019-11-02: 4 mg via INTRAVENOUS
  Filled 2019-11-02: qty 2

## 2019-11-02 MED ORDER — ALBUTEROL SULFATE HFA 108 (90 BASE) MCG/ACT IN AERS
2.0000 | INHALATION_SPRAY | Freq: Once | RESPIRATORY_TRACT | Status: AC
  Administered 2019-11-02: 2 via RESPIRATORY_TRACT
  Filled 2019-11-02: qty 6.7

## 2019-11-02 NOTE — ED Provider Notes (Signed)
[facility] Emergency Department Provider Note   ____________________________________________   First MD Initiated Contact with Patient 11/02/19 0302     (approximate)  I have reviewed the triage vital signs and the nursing notes.   HISTORY  Chief Complaint Chest Pain    HPI Jillian Andersen is a 34 y.o. female with no significant past medical history who presents to the ED complaining of fevers, cough, chest pain, and shortness of breath.  Patient reports that she has had approximately 1 week of sharp pain in her chest which is worse whenever she goes to take a deep breath or cough.  She has had a persistent dry cough with occasional difficulty catching her breath, additionally states that she has been nauseous with multiple episodes of vomiting and difficulty tolerating liquids or solids.  She denies any associated abdominal pain or diarrhea.  Her son has been sick with similar symptoms and she has not been vaccinated for COVID-19.        History reviewed. No pertinent past medical history.  There are no problems to display for this patient.   Past Surgical History:  Procedure Laterality Date  . CHOLECYSTECTOMY    . TUBAL LIGATION      Prior to Admission medications   Medication Sig Start Date End Date Taking? Authorizing Provider  ketorolac (TORADOL) 10 MG tablet Take 1 tablet (10 mg total) by mouth every 6 (six) hours as needed. 10/30/14   Triplett, Rulon Eisenmenger B, FNP  ondansetron (ZOFRAN ODT) 4 MG disintegrating tablet Take 1 tablet (4 mg total) by mouth every 8 (eight) hours as needed for nausea or vomiting. 11/02/19   Chesley Noon, MD  penicillin v potassium (VEETID) 250 MG tablet Take 1 tablet (250 mg total) by mouth 4 (four) times daily. 04/10/15   Jene Every, MD    Allergies Patient has no known allergies.  No family history on file.  Social History Social History   Tobacco Use  . Smoking status: Current Every Day Smoker    Packs/day:  0.50    Types: Cigarettes  . Smokeless tobacco: Never Used  Substance Use Topics  . Alcohol use: Yes  . Drug use: Never    Review of Systems  Constitutional: Positive for fever/chills Eyes: No visual changes. ENT: No sore throat. Cardiovascular: Positive for chest pain. Respiratory: Positive for cough and shortness of breath. Gastrointestinal: No abdominal pain.  Positive for nausea and vomiting.  No diarrhea.  No constipation. Genitourinary: Negative for dysuria. Musculoskeletal: Negative for back pain. Skin: Negative for rash. Neurological: Negative for headaches, focal weakness or numbness.  ____________________________________________   PHYSICAL EXAM:  VITAL SIGNS: ED Triage Vitals  Enc Vitals Group     BP 11/01/19 2159 127/70     Pulse Rate 11/01/19 2159 (!) 105     Resp 11/01/19 2159 18     Temp 11/01/19 2159 98.3 F (36.8 C)     Temp Source 11/01/19 2159 Oral     SpO2 11/01/19 2159 96 %     Weight 11/01/19 2200 270 lb (122.5 kg)     Height 11/01/19 2200 5\' 11"  (1.803 m)     Head Circumference --      Peak Flow --      Pain Score 11/01/19 2200 10     Pain Loc --      Pain Edu? --      Excl. in GC? --     Constitutional: Alert and oriented. Eyes: Conjunctivae are normal.  Head: Atraumatic. Nose: No congestion/rhinnorhea. Mouth/Throat: Mucous membranes are moist. Neck: Normal ROM Cardiovascular: Normal rate, regular rhythm. Grossly normal heart sounds. Respiratory: Normal respiratory effort.  No retractions. Lungs CTAB. Gastrointestinal: Soft and nontender. No distention. Genitourinary: deferred Musculoskeletal: No lower extremity tenderness nor edema. Neurologic:  Normal speech and language. No gross focal neurologic deficits are appreciated. Skin:  Skin is warm, dry and intact. No rash noted. Psychiatric: Mood and affect are normal. Speech and behavior are normal.  ____________________________________________   LABS (all labs ordered are listed,  but only abnormal results are displayed)  Labs Reviewed  SARS CORONAVIRUS 2 BY RT PCR (HOSPITAL ORDER, PERFORMED IN [facility]) - Abnormal; Notable for the following components:      Result Value   SARS Coronavirus 2 POSITIVE (*)    All other components within normal limits  BASIC METABOLIC PANEL - Abnormal; Notable for the following components:   Glucose, Bld 103 (*)    Creatinine, Ser 1.29 (*)    Calcium 8.7 (*)    GFR calc non Af Amer 53 (*)    All other components within normal limits  CBC - Abnormal; Notable for the following components:   Hemoglobin 15.3 (*)    All other components within normal limits  POC URINE PREG, ED  TROPONIN I (HIGH SENSITIVITY)   ____________________________________________  EKG  ED ECG REPORT I, Chesley Noon, the attending physician, personally viewed and interpreted this ECG.   Date: 11/02/2019  EKG Time: 22:04  Rate: 93  Rhythm: normal sinus rhythm  Axis: Normal  Intervals:none  ST&T Change: None   PROCEDURES  Procedure(s) performed (including Critical Care):  Procedures   ____________________________________________   INITIAL IMPRESSION / ASSESSMENT AND PLAN / ED COURSE       34 year old female with no significant past medical history presents to the ED complaining of 1 week of cough, pleuritic chest pain, shortness of breath, fevers, and vomiting.  Testing for COVID-19 was positive from triage however chest x-ray is reassuring with no evidence of pneumonia.  Patient is not in any respiratory distress on my evaluation, maintaining O2 sats on room air.  We will treat her symptomatically with albuterol, Zofran, and IV fluids.  Lab work is reassuring and troponin negative, I doubt ACS.  We will reassess following treatment and ensure patient can tolerate p.o.  Patient reports feeling better following Zofran and albuterol, she was subsequently able to tolerate ginger ale and crackers without difficulty.  On ambulation  about the room, her O2 sats varied between 90 and 94%.  She was offered admission for observation, but declines.  We will prescribe Zofran and patient was counseled to obtain pulse oximeter, schedule follow-up with PCP.  She was counseled to return to the ED for new worsening symptoms, patient agrees with plan.      ____________________________________________   FINAL CLINICAL IMPRESSION(S) / ED DIAGNOSES  Final diagnoses:  COVID-19 virus infection  Shortness of breath  Non-intractable vomiting with nausea, unspecified vomiting type     ED Discharge Orders         Ordered    ondansetron (ZOFRAN ODT) 4 MG disintegrating tablet  Every 8 hours PRN     Discontinue  Reprint     11/02/19 0516           Note:  This document was prepared using Dragon voice recognition software and may include unintentional dictation errors.   Chesley Noon, MD 11/02/19 [phone]

## 2019-11-02 NOTE — ED Notes (Signed)
Peripheral IV discontinued. Catheter intact. No signs of infiltration or redness. Gauze applied to IV site.    Discharge instructions reviewed with patient. Questions fielded by this RN. Patient verbalizes understanding of instructions. Patient discharged home in stable condition per [facility] . No acute distress noted at time of discharge.   patient ambulatory to DC

## 2019-11-02 NOTE — ED Notes (Signed)
Ambulation in room start at 91% - -O2 sats variable between 90% and 94% after walking for for 3 mins in room

## 2019-11-02 NOTE — ED Notes (Addendum)
ED Provider at bedside.  Pt reports approx 1 week of "flu like" s/sx - body aches, nausea, poor PO, cough, and sharp pain with deep inhalation  Pt reports son at home also coughing, pt counseled to seek testing for other family members

## 2019-11-03 ENCOUNTER — Telehealth: Payer: Self-pay | Admitting: Nurse Practitioner

## 2019-11-03 NOTE — Telephone Encounter (Signed)
Called to Discuss with patient about Covid symptoms and the use of bamlanivimab, a monoclonal antibody infusion for those with mild to moderate Covid symptoms and at a high risk of hospitalization.     Pt states that symptoms started 10/24/19 and therefore she would be out of the window for infusion.

## 2019-11-10 ENCOUNTER — Telehealth: Payer: Self-pay | Admitting: General Practice

## 2019-11-10 NOTE — Telephone Encounter (Signed)
ED referral- left message

## 2020-12-06 ENCOUNTER — Ambulatory Visit: Payer: Self-pay

## 2020-12-13 ENCOUNTER — Telehealth: Payer: Self-pay | Admitting: *Deleted

## 2020-12-13 ENCOUNTER — Ambulatory Visit: Payer: Self-pay | Attending: Oncology

## 2021-09-13 ENCOUNTER — Ambulatory Visit: Payer: Self-pay

## 2021-09-13 ENCOUNTER — Other Ambulatory Visit: Payer: Self-pay | Admitting: *Deleted

## 2021-09-13 DIAGNOSIS — N6320 Unspecified lump in the left breast, unspecified quadrant: Secondary | ICD-10-CM

## 2021-09-19 ENCOUNTER — Ambulatory Visit: Payer: Self-pay

## 2021-09-20 ENCOUNTER — Inpatient Hospital Stay: Admission: RE | Admit: 2021-09-20 | Payer: Self-pay | Source: Ambulatory Visit

## 2021-09-20 ENCOUNTER — Other Ambulatory Visit: Payer: Self-pay

## 2021-10-31 ENCOUNTER — Ambulatory Visit: Payer: Self-pay | Attending: Hematology and Oncology | Admitting: *Deleted

## 2021-10-31 ENCOUNTER — Ambulatory Visit
Admission: RE | Admit: 2021-10-31 | Discharge: 2021-10-31 | Disposition: A | Discharge status: Home / Self Care | Payer: Self-pay | Source: Ambulatory Visit | Attending: Obstetrics and Gynecology | Admitting: Obstetrics and Gynecology

## 2021-10-31 ENCOUNTER — Encounter: Payer: Self-pay | Admitting: *Deleted

## 2021-10-31 ENCOUNTER — Ambulatory Visit: Admission: RE | Admit: 2021-10-31 | Payer: Self-pay | Source: Ambulatory Visit

## 2021-10-31 ENCOUNTER — Other Ambulatory Visit: Payer: Self-pay | Admitting: Obstetrics and Gynecology

## 2021-10-31 ENCOUNTER — Ambulatory Visit: Payer: Self-pay

## 2021-10-31 VITALS — BP 124/46 | Wt 269.7 lb

## 2021-10-31 DIAGNOSIS — N6320 Unspecified lump in the left breast, unspecified quadrant: Secondary | ICD-10-CM

## 2021-10-31 DIAGNOSIS — Z0000 Encounter for general adult medical examination without abnormal findings: Secondary | ICD-10-CM

## 2021-10-31 NOTE — Progress Notes (Signed)
Ms. Jillian Andersen is a 35 y.o. female who presents to [facility] clinic today with complaint of a left breast mass and inverted nipple.  Patient states she has episodes of waxing and weaning left breast masses.  States her breast at times gets "black and blue" and the nipple inverts.  States it comes and goes and is not always associated with her menstrual cycle.  States this has been happening over the last year.  Patient states the mass is not present at the current time.  Patient has her daughter with her today.     Pap Smear: Pap not smear completed today. Patient states she has a history of cervical cancer.   States she has not had a pap smear in a long time.  Last Pap smear result is not available in Epic.   Physical exam: Breasts Breasts symmetrical. No skin abnormalities bilateral breasts. No nipple retraction bilateral breasts. No nipple discharge bilateral breasts. No lymphadenopathy. No lumps palpated bilateral breasts.       Pelvic/Bimanual Pap is not indicated today    Smoking History: Patient has is a current smoker at 1/2 packs per day. Patient is referred to quit line.    Patient Navigation: Patient education provided. Access to services provided for patient through [facility] program. No interpreter provided. No transportation provided   Colorectal Cancer Screening: Per patient has never had colonoscopy completed.  No complaints today. Patient is not age appropriate for colon screening at this time.   Breast and Cervical Cancer Risk Assessment: Patient has family history of breast cancer in her maternal aunt, no known genetic mutations, or radiation treatment to the chest before age 37. Patient has history of cervical dysplasia, immunocompromised, or DES exposure in-utero.  Risk Assessment     Risk Scores       10/31/2021   Last edited by: Narda Rutherford, LPN   5-year risk: 0.4 %   Lifetime risk: 9.1 %            A: [facility] exam without pap smear Complaint of left breast  mass  P: Referred patient to the [facility] for a screening mammogram. Appointment scheduled today.  [name], RN 10/31/2021 4:07 PM

## 2021-11-01 ENCOUNTER — Other Ambulatory Visit: Payer: Self-pay | Admitting: Obstetrics and Gynecology

## 2021-11-01 DIAGNOSIS — N6489 Other specified disorders of breast: Secondary | ICD-10-CM

## 2021-11-01 DIAGNOSIS — R928 Other abnormal and inconclusive findings on diagnostic imaging of breast: Secondary | ICD-10-CM

## 2021-11-20 ENCOUNTER — Ambulatory Visit
Admission: RE | Admit: 2021-11-20 | Discharge: 2021-11-20 | Disposition: A | Discharge status: Home / Self Care | Payer: Self-pay | Source: Ambulatory Visit | Attending: Obstetrics and Gynecology | Admitting: Obstetrics and Gynecology

## 2021-11-20 DIAGNOSIS — R928 Other abnormal and inconclusive findings on diagnostic imaging of breast: Secondary | ICD-10-CM | POA: Insufficient documentation

## 2021-11-20 DIAGNOSIS — N6489 Other specified disorders of breast: Secondary | ICD-10-CM

## 2021-11-21 ENCOUNTER — Ambulatory Visit: Payer: Self-pay

## 2022-01-09 ENCOUNTER — Telehealth: Payer: Self-pay | Admitting: *Deleted

## 2022-01-09 ENCOUNTER — Ambulatory Visit: Payer: Self-pay

## 2022-02-16 IMAGING — CR DG CHEST 2V
1 series · 2 of 2 positions shown · non-contrast
Comparison: None.

CLINICAL DATA: Central chest pain, cough and chills for 1 week

EXAM:
CHEST - 2 VIEW

[Series 1: dg chest 2 view · 0.14mm/px · 2 of 2 slices shown]
[im 1/2]
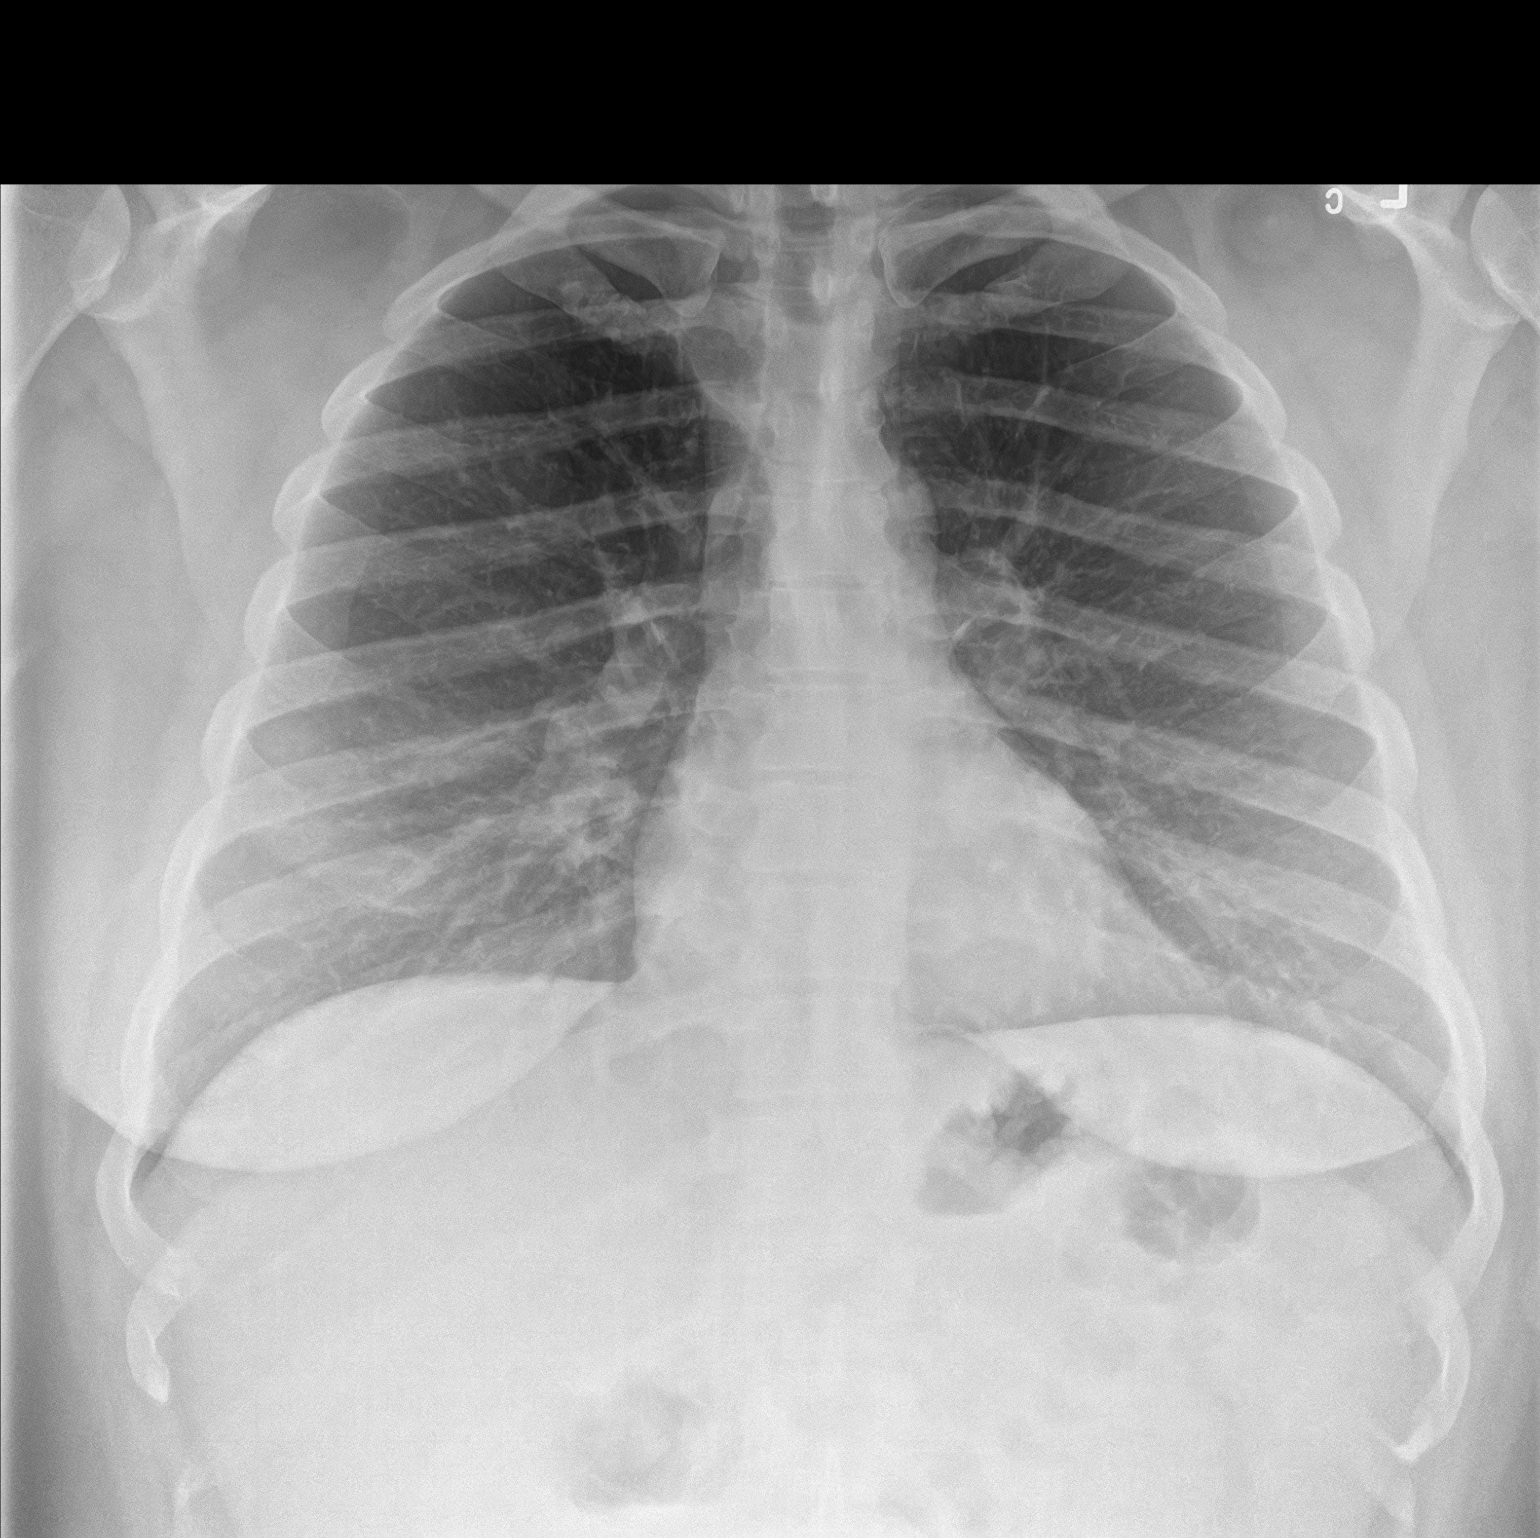
[im 2/2]
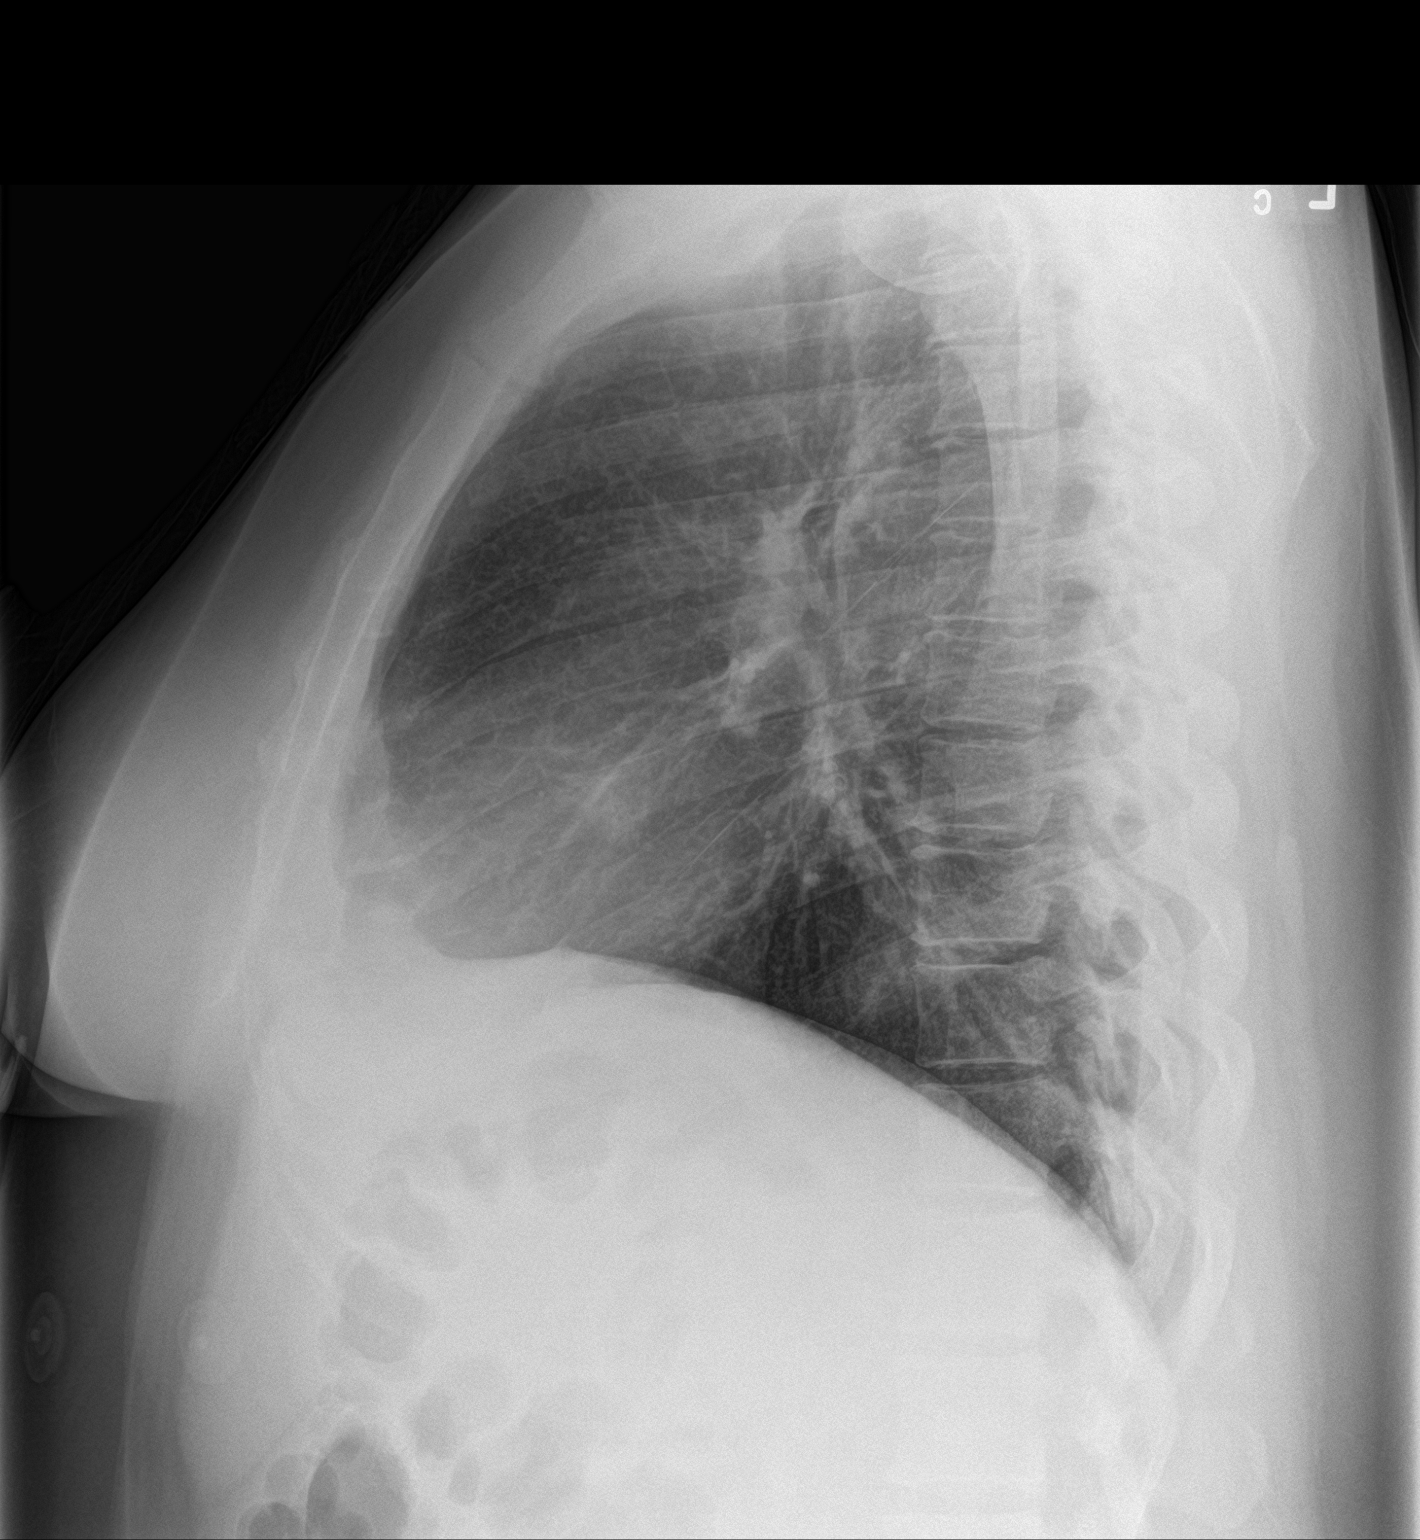

[2 of 2 positions shown; findings below may reference images not displayed]

FINDINGS: No consolidation, features of edema, pneumothorax, or effusion.
Pulmonary vascularity is normally distributed. The cardiomediastinal
contours are unremarkable. No acute osseous or soft tissue
abnormality.
IMPRESSION: No acute cardiopulmonary abnormality.

## 2022-02-26 ENCOUNTER — Ambulatory Visit: Payer: Self-pay

## 2022-08-04 ENCOUNTER — Emergency Department: Payer: Self-pay

## 2022-08-04 ENCOUNTER — Emergency Department
Admission: EM | Admit: 2022-08-04 | Discharge: 2022-08-04 | Disposition: A | Discharge status: Home / Self Care | Payer: Self-pay | Attending: Emergency Medicine | Admitting: Emergency Medicine

## 2022-08-04 ENCOUNTER — Other Ambulatory Visit: Payer: Self-pay

## 2022-08-04 DIAGNOSIS — Z8541 Personal history of malignant neoplasm of cervix uteri: Secondary | ICD-10-CM | POA: Insufficient documentation

## 2022-08-04 DIAGNOSIS — R55 Syncope and collapse: Secondary | ICD-10-CM | POA: Insufficient documentation

## 2022-08-04 DIAGNOSIS — R519 Headache, unspecified: Secondary | ICD-10-CM | POA: Insufficient documentation

## 2022-08-04 DIAGNOSIS — R42 Dizziness and giddiness: Secondary | ICD-10-CM | POA: Insufficient documentation

## 2022-08-04 LAB — CBC
HCT: 44.1 % (ref 36.0–46.0)
Hemoglobin: 14.2 g/dL (ref 12.0–15.0)
MCH: 29.2 pg (ref 26.0–34.0)
MCHC: 32.2 g/dL (ref 30.0–36.0)
MCV: 90.7 fL (ref 80.0–100.0)
Platelets: 293 10*3/uL (ref 150–400)
RBC: 4.86 MIL/uL (ref 3.87–5.11)
RDW: 13.2 % (ref 11.5–15.5)
WBC: 10.4 10*3/uL (ref 4.0–10.5)
nRBC: 0 % (ref 0.0–0.2)

## 2022-08-04 LAB — TROPONIN I (HIGH SENSITIVITY): Troponin I (High Sensitivity): <2 ng/L (ref <18)

## 2022-08-04 LAB — BASIC METABOLIC PANEL
Anion gap: 9 (ref 5–15)
BUN: 18 mg/dL (ref 6–20)
CO2: 24 mmol/L (ref 22–32)
Calcium: 9.1 mg/dL (ref 8.9–10.3)
Chloride: 105 mmol/L (ref 98–111)
Creatinine, Ser: 0.8 mg/dL (ref 0.44–1.00)
GFR, Estimated: >60 mL/min (ref >60)
Glucose, Bld: 100 mg/dL — ABNORMAL HIGH (ref 70–99)
Potassium: 4 mmol/L (ref 3.5–5.1)
Sodium: 138 mmol/L (ref 135–145)

## 2022-08-04 MED ORDER — PROCHLORPERAZINE EDISYLATE 10 MG/2ML IJ SOLN
10.0000 mg | Freq: Once | INTRAMUSCULAR | Status: AC
  Administered 2022-08-04: 10 mg via INTRAVENOUS
  Filled 2022-08-04: qty 2

## 2022-08-04 MED ORDER — ACETAMINOPHEN 500 MG PO TABS
1000.0000 mg | ORAL_TABLET | Freq: Once | ORAL | Status: AC
  Administered 2022-08-04: 1000 mg via ORAL
  Filled 2022-08-04: qty 2

## 2022-08-04 MED ORDER — DIPHENHYDRAMINE HCL 50 MG/ML IJ SOLN
25.0000 mg | Freq: Once | INTRAMUSCULAR | Status: AC
  Administered 2022-08-04: 25 mg via INTRAVENOUS
  Filled 2022-08-04: qty 1

## 2022-08-04 MED ORDER — SODIUM CHLORIDE 0.9 % IV BOLUS
1000.0000 mL | Freq: Once | INTRAVENOUS | Status: AC
  Administered 2022-08-04: 1000 mL via INTRAVENOUS

## 2022-08-04 NOTE — ED Provider Notes (Signed)
[facility] Provider Note    Event Date/Time   First MD Initiated Contact with Patient 08/04/22 1100     (approximate)   History   Chest Pain and Loss of Consciousness   HPI  Jillian Andersen is a 37 y.o. female past medical history significant for prior cervical cancer and gallbladder tumor, who presents to the emergency department following an episode of syncope.  Patient states that she woke up in the middle of the night after having a bad dream where she was having visions that her heart stopped working.  States that when she got up she felt very dizzy and then woke up on the floor.  Endorses urinary incontinence but no tongue biting.  Now complaining of some dizziness and headache.  States that this feels more similar to prior headaches that she has had in the past.  Felt in her normal state of health prior to going to sleep last night.  Not on anticoagulation and takes no daily medications.  Does not currently follow with a primary care physician.  Denies any fever or chills.  Denies any chest pain or shortness of breath.  Prior tubal ligation and no concern for pregnancy.  Denies any abdominal pain, nausea, vomiting.  No recent illnesses.  No tinnitus or change of hearing.     Physical Exam   Triage Vital Signs: ED Triage Vitals  Enc Vitals Group     BP 08/04/22 1102 128/71     Pulse Rate 08/04/22 1102 70     Resp 08/04/22 1102 16     Temp 08/04/22 1102 98 F (36.7 C)     Temp Source 08/04/22 1102 Oral     SpO2 08/04/22 1102 98 %     Weight --      Height --      Head Circumference --      Peak Flow --      Pain Score 08/04/22 1059 3     Pain Loc --      Pain Edu? --      Excl. in GC? --     Most recent vital signs: Vitals:   08/04/22 1230 08/04/22 1330  BP: 110/61 123/67  Pulse: (!) 59 75  Resp: 16 13  Temp:    SpO2: 98% 100%    Physical Exam Constitutional:      Appearance: She is well-developed.  HENT:     Head: Atraumatic.   Eyes:     Conjunctiva/sclera: Conjunctivae normal.  Cardiovascular:     Rate and Rhythm: Regular rhythm.     Heart sounds: Normal heart sounds.  Pulmonary:     Effort: No respiratory distress.  Abdominal:     General: There is no distension.     Palpations: Abdomen is soft.  Musculoskeletal:        General: Normal range of motion.     Cervical back: Normal range of motion.  Skin:    General: Skin is warm.  Neurological:     Mental Status: She is alert. Mental status is at baseline.     GCS: GCS eye subscore is 4. GCS verbal subscore is 5. GCS motor subscore is 6.     Cranial Nerves: Cranial nerves 2-12 are intact.     Motor: Motor function is intact.     Coordination: Coordination is intact.     Gait: Gait is intact.     Deep Tendon Reflexes: Reflexes are normal and symmetric.  IMPRESSION / MDM / ASSESSMENT AND PLAN / ED COURSE  I reviewed the triage vital signs and the nursing notes.  Differential diagnosis including seizure, syncope, migraine headache, electrolyte abnormality, dehydration, orthostatic hypotension, peripheral vertigo.  On chart review patient had a history of migraines in the past.  EKG  I, Corena Herter, the attending physician, personally viewed and interpreted this ECG.   Rate: Normal  Rhythm: Normal sinus  Axis: Normal  Intervals: Normal  ST&T Change: None  No tachycardic or bradycardic dysrhythmias while on cardiac telemetry.  RADIOLOGY I independently reviewed imaging, my interpretation of imaging: CT scan of the head with no signs of intracranial hemorrhage or infarction.  Chest x-ray with no focal findings consistent with pneumonia.  LABS (all labs ordered are listed, but only abnormal results are displayed) Labs interpreted as -    Labs Reviewed  BASIC METABOLIC PANEL - Abnormal; Notable for the following components:      Result Value   Glucose, Bld 100 (*)    All other components within normal limits  CBC  TROPONIN I (HIGH  SENSITIVITY)     MDM    Patient treated with IV fluids, Compazine and Benadryl.  Lab work without significant electrolyte abnormalities.  CT scan of head with no signs of intracranial hemorrhage or infarction.  Chest x-ray without findings of pneumonia.  No significant electrolyte abnormalities. Low suspicion for ACS, low risk heart score and troponin is undetectable, no chest pain at this time do not feel that repeat troponin is necessary at this time.  Possible seizure but no history of seizures, no signs of intracranial hemorrhage and has a nonfocal neurologic exam.  Do not feel that the patient needs emergent workup with neurology and discussed close follow-up with primary care physician.  On reevaluation resolution of her symptoms.  Able to ambulate in the emergency department without any difficulties.  No gait instability.  Orthostatic blood pressures are negative.  Given information to follow-up closely with primary care physician.  Given return precautions to the emergency department for any recurrent episodes.   PROCEDURES:  Critical Care performed: No  Procedures  Patient's presentation is most consistent with acute presentation with potential threat to life or bodily function.   MEDICATIONS ORDERED IN ED: Medications  sodium chloride 0.9 % bolus 1,000 mL (0 mLs Intravenous Stopped 08/04/22 1406)  prochlorperazine (COMPAZINE) injection 10 mg (10 mg Intravenous Given 08/04/22 1133)  diphenhydrAMINE (BENADRYL) injection 25 mg (25 mg Intravenous Given 08/04/22 1133)  acetaminophen (TYLENOL) tablet 1,000 mg (1,000 mg Oral Given 08/04/22 1132)    FINAL CLINICAL IMPRESSION(S) / ED DIAGNOSES   Final diagnoses:  Syncope, unspecified syncope type  Dizzy  Acute nonintractable headache, unspecified headache type     Rx / DC Orders   ED Discharge Orders          Ordered    Ambulatory Referral to Primary Care (Establish Care)        08/04/22 1358             Note:   This document was prepared using Dragon voice recognition software and may include unintentional dictation errors.   Corena Herter, MD 08/04/22 1534

## 2022-08-04 NOTE — ED Triage Notes (Addendum)
Pt to ED via [facility] from work. Pt had 2 syncopal episodes today. Pt reports 1st episode at 0300 and had another syncopal episode at work. Pt reports dizziness and CP.  Pt with hx of cervical cancer.    ASA NSR  BP 140 systolic CBG 91 96% RA

## 2022-08-04 NOTE — Discharge Instructions (Signed)
You were seen in the emergency department following an episode of passing out and then you had a headache and dizziness.  You had a CT scan done of your head that did not show any abnormalities.  Your lab work was normal.  You are given a migraine headache cocktail with improvement of your symptoms.  You are given information to follow-up closely with a primary care provider.  Return to the emergency department if you have any recurrent episodes of your symptoms today or if you have any new symptoms that are concerning.  Stay hydrated and drink plenty of fluids.

## 2022-08-04 NOTE — ED Notes (Signed)
Patient transported to CT 

## 2024-02-11 ENCOUNTER — Other Ambulatory Visit: Payer: Self-pay

## 2024-02-11 ENCOUNTER — Encounter: Payer: Self-pay | Admitting: Emergency Medicine

## 2024-02-11 ENCOUNTER — Emergency Department

## 2024-02-11 ENCOUNTER — Emergency Department
Admission: EM | Admit: 2024-02-11 | Discharge: 2024-02-11 | Disposition: A | Discharge status: Home / Self Care | Attending: Emergency Medicine | Admitting: Emergency Medicine

## 2024-02-11 DIAGNOSIS — M25572 Pain in left ankle and joints of left foot: Secondary | ICD-10-CM | POA: Diagnosis not present

## 2024-02-11 DIAGNOSIS — M25552 Pain in left hip: Secondary | ICD-10-CM | POA: Diagnosis not present

## 2024-02-11 DIAGNOSIS — W1839XA Other fall on same level, initial encounter: Secondary | ICD-10-CM | POA: Diagnosis not present

## 2024-02-11 DIAGNOSIS — M7732 Calcaneal spur, left foot: Secondary | ICD-10-CM | POA: Diagnosis not present

## 2024-02-11 DIAGNOSIS — M79605 Pain in left leg: Secondary | ICD-10-CM | POA: Insufficient documentation

## 2024-02-11 DIAGNOSIS — M19072 Primary osteoarthritis, left ankle and foot: Secondary | ICD-10-CM | POA: Diagnosis not present

## 2024-02-11 MED ORDER — ACETAMINOPHEN 500 MG PO TABS
1000.0000 mg | ORAL_TABLET | Freq: Once | ORAL | Status: AC
  Administered 2024-02-11: 1000 mg via ORAL
  Filled 2024-02-11: qty 2

## 2024-02-11 MED ORDER — KETOROLAC TROMETHAMINE 30 MG/ML IJ SOLN
30.0000 mg | Freq: Once | INTRAMUSCULAR | Status: AC
  Administered 2024-02-11: 30 mg via INTRAVENOUS
  Filled 2024-02-11: qty 1

## 2024-02-11 MED ORDER — ACETAMINOPHEN 500 MG PO TABS: 1000.0000 mg | ORAL_TABLET | Freq: Three times a day (TID) | ORAL | 0 refills | Status: AC | PRN

## 2024-02-11 MED ORDER — PREDNISONE 10 MG (21) PO TBPK: ORAL_TABLET | ORAL | 0 refills | Status: AC

## 2024-02-11 MED ORDER — LIDOCAINE 5 % EX PTCH
1.0000 | MEDICATED_PATCH | Freq: Once | CUTANEOUS | Status: DC
  Administered 2024-02-11: 1 via TRANSDERMAL
  Filled 2024-02-11: qty 1

## 2024-02-11 NOTE — Discharge Instructions (Addendum)
 You were seen in the emergency department for pain in your left leg.  Your x-rays did not show any fractures today.  I believe this is likely coming from a nerve from your back.  Please pick up and take the medications as prescribed.  I have placed a ambulatory referral to primary care to establish care.  Please look through the resources listed in the discharge paperwork and give one of their offices call to establish care.  Return to the emergency department if you develop numbness in your bowel or bladder area, peeing or pooping on yourself, fever, pain or burning with urination or any other new, worsening or concerning symptoms.
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
## 2024-04-22 ENCOUNTER — Ambulatory Visit: Payer: Self-pay

## 2024-04-22 DIAGNOSIS — Z113 Encounter for screening for infections with a predominantly sexual mode of transmission: Secondary | ICD-10-CM

## 2024-04-22 DIAGNOSIS — R4586 Emotional lability: Secondary | ICD-10-CM

## 2024-04-22 DIAGNOSIS — B3731 Acute candidiasis of vulva and vagina: Secondary | ICD-10-CM

## 2024-04-22 LAB — WET PREP FOR TRICH, YEAST, CLUE
Clue Cell Exam: NEGATIVE
Trichomonas Exam: NEGATIVE

## 2024-04-22 LAB — HM HEPATITIS C SCREENING LAB: HM Hepatitis Screen: NEGATIVE

## 2024-04-22 LAB — HM HIV SCREENING LAB: HM HIV Screening: NEGATIVE

## 2024-04-22 MED ORDER — FLUCONAZOLE 150 MG PO TABS: 150.0000 mg | ORAL_TABLET | Freq: Once | ORAL | Status: AC

## 2024-04-22 MED ORDER — METRONIDAZOLE 500 MG PO TABS: 500.0000 mg | ORAL_TABLET | Freq: Two times a day (BID) | ORAL | Status: DC
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
## 2024-04-22 NOTE — Progress Notes (Signed)
 Pt is here for STD screening. Wet prep results review with patient and was dispensed, #1 Diflucan  150 mg tablet oral once. I provided counseling regarding the medication, the side effects and when to call clinic. Opportunity given to pt to ask questions for any clarifications. Questions Answered. Condoms declined. Wilkie Drought, RN.

## 2024-04-24 LAB — HBV ANTIGEN/ANTIBODY STATE LAB
Hep B Core Total Ab: NONREACTIVE
Hep B S Ab: NONREACTIVE
Hepatitis B Surface Ag: NONREACTIVE

## 2024-04-27 LAB — GONOCOCCUS CULTURE
